# Patient Record
Sex: Female | Born: 1985 | Race: White | Hispanic: No | Marital: Single | Smoking: Never smoker
Health system: Southern US, Community
[De-identification: ages and names within clinical notes are randomized; demographics above are authoritative.]

## PROBLEM LIST (undated history)

## (undated) DIAGNOSIS — O24419 Gestational diabetes mellitus in pregnancy, unspecified control: Secondary | ICD-10-CM

## (undated) DIAGNOSIS — Z789 Other specified health status: Secondary | ICD-10-CM

## (undated) DIAGNOSIS — R519 Headache, unspecified: Secondary | ICD-10-CM

## (undated) HISTORY — DX: Gestational diabetes mellitus in pregnancy, unspecified control: O24.419

## (undated) HISTORY — PX: CHOLECYSTECTOMY: SHX55

## (undated) HISTORY — DX: Headache, unspecified: R51.9

---

## 2012-10-28 HISTORY — PX: CHOLECYSTECTOMY: SHX55

## 2018-10-28 NOTE — L&D Delivery Note (Signed)
Delivery Note At 11:51 AM a viable and healthy female was delivered via Vaginal, Spontaneous (Presentation: OA. Compound left arm).  APGAR: 9, 9; weight  pending.   Placenta status: spontaneous, intact, calcified,.  Cord: 3VC with the following complications: None.  Cord pH: NA  Anesthesia:  Epidural Episiotomy: None Lacerations: None Suture Repair: NA Est. Blood Loss (mL): 50  Mom to postpartum.  Baby to Couplet care / Skin to Skin. Placenta to: L&D Feeding: Breast Circ: Yes  Contraception: Pt planned PPBTL, but Consent was not signed. Discussed interval BTL and BC options until then.   Manya Silvas 04/06/2019, 12:45 PM

## 2018-11-18 ENCOUNTER — Ambulatory Visit (INDEPENDENT_AMBULATORY_CARE_PROVIDER_SITE_OTHER): Payer: Medicaid Other | Admitting: Obstetrics and Gynecology

## 2018-11-18 ENCOUNTER — Encounter: Payer: Self-pay | Admitting: Obstetrics and Gynecology

## 2018-11-18 ENCOUNTER — Other Ambulatory Visit (HOSPITAL_COMMUNITY)
Admission: RE | Admit: 2018-11-18 | Discharge: 2018-11-18 | Disposition: A | Discharge status: Home / Self Care | Payer: Medicaid Other | Source: Ambulatory Visit | Attending: Obstetrics and Gynecology | Admitting: Obstetrics and Gynecology

## 2018-11-18 VITALS — BP 127/79 | HR 96 | Ht 63.0 in | Wt 236.3 lb

## 2018-11-18 DIAGNOSIS — Z3490 Encounter for supervision of normal pregnancy, unspecified, unspecified trimester: Secondary | ICD-10-CM | POA: Insufficient documentation

## 2018-11-18 DIAGNOSIS — O99210 Obesity complicating pregnancy, unspecified trimester: Secondary | ICD-10-CM

## 2018-11-18 DIAGNOSIS — O99212 Obesity complicating pregnancy, second trimester: Secondary | ICD-10-CM

## 2018-11-18 DIAGNOSIS — Z3482 Encounter for supervision of other normal pregnancy, second trimester: Secondary | ICD-10-CM | POA: Diagnosis not present

## 2018-11-18 DIAGNOSIS — O0930 Supervision of pregnancy with insufficient antenatal care, unspecified trimester: Secondary | ICD-10-CM

## 2018-11-18 DIAGNOSIS — Z3480 Encounter for supervision of other normal pregnancy, unspecified trimester: Secondary | ICD-10-CM | POA: Diagnosis not present

## 2018-11-18 DIAGNOSIS — O0932 Supervision of pregnancy with insufficient antenatal care, second trimester: Secondary | ICD-10-CM

## 2018-11-18 NOTE — Progress Notes (Signed)
New OB Note  11/19/2018   Clinic: [facility]  Chief Complaint: NOB  Transfer of Care Patient: no  History of Present Illness: Ms. Nichole Edwards is a 33 y.o. G2P1 @ 19/1 weeks ([facility] 6/16 [tentative], based on Patient's last menstrual period was 07/07/2018 (within days).).  Preg complicated by has Encounter for supervision of normal pregnancy, antepartum; Late prenatal care; BMI 30s; and Obesity in pregnancy on their problem list.   Any events prior to today's visit: no She was using OCPs when she conceived.  She has Negative signs or symptoms of nausea/vomiting of pregnancy. She has Negative signs or symptoms of miscarriage or preterm labor  ROS: A 12-point review of systems was performed and negative, except as stated in the above HPI.  OBGYN History: As per HPI. OB History  Gravida Para Term Preterm AB Living  2 1 1     1   SAB TAB Ectopic Multiple Live Births               # Outcome Date GA Lbr Len/2nd Weight Sex Delivery Anes PTL Lv  2 Current           1 Term 2011   8 lb 10 oz (3.912 kg)  Vag-Spont       Any issues with any prior pregnancies: no Prior children are healthy, doing well, and without any problems or issues: no History of pap smears: No. Last pap smear Unknown   Past Medical History: History reviewed. No pertinent past medical history.  Past Surgical History: Past Surgical History:  Procedure Laterality Date  . CHOLECYSTECTOMY      Family History:  History reviewed. No pertinent family history. She denies any history of mental retardation, birth defects or genetic disorders in her or the FOB's history  Social History:  Social History   Socioeconomic History  . Marital status: Single    Spouse name: Not on file  . Number of children: Not on file  . Years of education: Not on file  . Highest education level: Not on file  Occupational History  . Not on file  Social Needs  . Financial resource strain: Not on file  . Food insecurity:    Worry: Not on file     Inability: Not on file  . Transportation needs:    Medical: Not on file    Non-medical: Not on file  Tobacco Use  . Smoking status: Not on file  Substance and Sexual Activity  . Alcohol use: Not on file  . Drug use: Not on file  . Sexual activity: Not on file  Lifestyle  . Physical activity:    Days per week: Not on file    Minutes per session: Not on file  . Stress: Not on file  Relationships  . Social connections:    Talks on phone: Not on file    Gets together: Not on file    Attends religious service: Not on file    Active member of club or organization: Not on file    Attends meetings of clubs or organizations: Not on file    Relationship status: Not on file  . Intimate partner violence:    Fear of current or ex partner: Not on file    Emotionally abused: Not on file    Physically abused: Not on file    Forced sexual activity: Not on file  Other Topics Concern  . Not on file  Social History Narrative  . Not on file  Allergy: Not on File  Health Maintenance:  Mammogram Up to Date: not applicable  Current Outpatient Medications: PNV  Physical Exam:   BP 127/79   Pulse 96   Ht 5\' 3"  (1.6 m)   Wt 236 lb 4.8 oz (107.2 kg)   LMP 07/07/2018 (Within Days)   BMI 41.86 kg/m  Body mass index is 41.86 kg/m. Contractions: Not present Vag. Bleeding: None. Fundal height: 20 FHTs: 150s  General appearance: Well nourished, well developed female in no acute distress.  Cardiovascular: S1, S2 normal, no murmur, rub or gallop, regular rate and rhythm Respiratory:  Clear to auscultation bilateral. Normal respiratory effort Abdomen: positive bowel sounds and no masses, hernias; diffusely non tender to palpation, non distended Breasts: patient declines any breast s/s. Neuro/Psych:  Normal mood and affect.  Skin:  Warm and dry.  Lymphatic:  No inguinal lymphadenopathy.   Pelvic exam: is not limited by body habitus EGBUS: within normal limits, Vagina: within  normal limits and with no blood in the vault, Cervix: normal appearing cervix without discharge or lesions, closed/long/high, Uterus:  enlarged, c/w 20 week size, and Adnexa:  normal adnexa and no mass, fullness, tenderness  Laboratory: none  Imaging:  Patient states had u/s on 11/12 and was given EDC of 6/17 based on u/s. Image on phone reviewed and dates shows 11/12. CRL not measured on that image but looks to be about 9wks  Assessment: pt stable  Plan: 1. Supervision of other normal pregnancy, antepartum Routine care. Anatomy u/s ordered. Confirm dating with u/s. Pt to bring images to next visit. Pt amenable to genetic screening - Cytology - PAP( [place]) - Culture, OB Urine - Hemoglobinopathy evaluation - Obstetric Panel, Including HIV - Inheritest(R) CF/SMA Panel - Genetic Screening - AFP, Serum, Open Spina Bifida - TSH - CMP and Liver - Protein / creatinine ratio, urine - [place] MFM OB DETAIL +14 WK; Future  2. Late prenatal care  3. Obesity in pregnancy - TSH - CMP and Liver - Protein / creatinine ratio, urine  Problem list reviewed and updated.  Follow up in 3 weeks.  The nature of [facility] with multiple MDs and other Advanced Practice Providers was explained to patient; also emphasized that residents, students are part of our team.  >50% of 25 min visit spent on counseling and coordination of care.     Cornelia Copa MD Attending Center for [facility])

## 2018-11-18 NOTE — Patient Instructions (Addendum)
Your tentative due date is June 16th and today you are 19 weeks and 1 day We will finalize this after your anatomy ultrasounds and the pictures you bring [place] from your first ultrasound

## 2018-11-19 ENCOUNTER — Ambulatory Visit (HOSPITAL_COMMUNITY)
Admission: RE | Admit: 2018-11-19 | Discharge: 2018-11-19 | Disposition: A | Discharge status: Home / Self Care | Payer: Medicaid Other | Source: Ambulatory Visit | Attending: Obstetrics and Gynecology | Admitting: Obstetrics and Gynecology

## 2018-11-19 ENCOUNTER — Encounter: Payer: Self-pay | Admitting: Obstetrics and Gynecology

## 2018-11-19 ENCOUNTER — Encounter (HOSPITAL_COMMUNITY): Payer: Self-pay

## 2018-11-19 ENCOUNTER — Encounter: Payer: Self-pay | Admitting: *Deleted

## 2018-11-19 DIAGNOSIS — Z3480 Encounter for supervision of other normal pregnancy, unspecified trimester: Secondary | ICD-10-CM | POA: Diagnosis not present

## 2018-11-19 DIAGNOSIS — Z3A19 19 weeks gestation of pregnancy: Secondary | ICD-10-CM | POA: Diagnosis not present

## 2018-11-19 DIAGNOSIS — O99212 Obesity complicating pregnancy, second trimester: Secondary | ICD-10-CM | POA: Diagnosis not present

## 2018-11-19 DIAGNOSIS — Z363 Encounter for antenatal screening for malformations: Secondary | ICD-10-CM

## 2018-11-19 HISTORY — DX: Other specified health status: Z78.9

## 2018-11-19 LAB — PROTEIN / CREATININE RATIO, URINE
Creatinine, Urine: 19.4 mg/dL
Protein, Ur: 9.3 mg/dL
Protein/Creat Ratio: 479 mg/g creat — ABNORMAL HIGH (ref 0–200)

## 2018-11-20 ENCOUNTER — Other Ambulatory Visit (HOSPITAL_COMMUNITY): Payer: Self-pay | Admitting: *Deleted

## 2018-11-20 DIAGNOSIS — O99212 Obesity complicating pregnancy, second trimester: Secondary | ICD-10-CM

## 2018-11-20 DIAGNOSIS — O99213 Obesity complicating pregnancy, third trimester: Secondary | ICD-10-CM

## 2018-11-20 LAB — CYTOLOGY - PAP
Chlamydia: NEGATIVE
Diagnosis: NEGATIVE
HPV: NOT DETECTED
Neisseria Gonorrhea: NEGATIVE

## 2018-11-21 LAB — URINE CULTURE, OB REFLEX

## 2018-11-21 LAB — CULTURE, OB URINE

## 2018-11-23 ENCOUNTER — Encounter: Payer: Self-pay | Admitting: Obstetrics and Gynecology

## 2018-11-23 DIAGNOSIS — Z3480 Encounter for supervision of other normal pregnancy, unspecified trimester: Secondary | ICD-10-CM

## 2018-11-23 DIAGNOSIS — O2340 Unspecified infection of urinary tract in pregnancy, unspecified trimester: Secondary | ICD-10-CM | POA: Insufficient documentation

## 2018-11-23 MED ORDER — NITROFURANTOIN MONOHYD MACRO 100 MG PO CAPS: ORAL_CAPSULE | ORAL | 0 refills | Status: DC

## 2018-11-23 NOTE — Addendum Note (Signed)
Addended by: [place] Bing on: 11/23/2018 07:52 AM   Modules accepted: Orders

## 2018-12-02 LAB — CMP AND LIVER
ALT: 8 IU/L (ref 0–32)
AST: 10 IU/L (ref 0–40)
Albumin: 3.7 g/dL — ABNORMAL LOW (ref 3.8–4.8)
Alkaline Phosphatase: 53 IU/L (ref 39–117)
BUN: 5 mg/dL — ABNORMAL LOW (ref 6–20)
Bilirubin Total: <0.2 mg/dL (ref 0.0–1.2)
Bilirubin, Direct: 0.04 mg/dL (ref 0.00–0.40)
CO2: 22 mmol/L (ref 20–29)
Calcium: 9.3 mg/dL (ref 8.7–10.2)
Chloride: 102 mmol/L (ref 96–106)
Creatinine, Ser: 0.58 mg/dL (ref 0.57–1.00)
GFR calc Af Amer: 141 mL/min/{1.73_m2} (ref >59)
GFR calc non Af Amer: 122 mL/min/{1.73_m2} (ref >59)
Glucose: 94 mg/dL (ref 65–99)
Potassium: 4 mmol/L (ref 3.5–5.2)
Sodium: 139 mmol/L (ref 134–144)
Total Protein: 6.3 g/dL (ref 6.0–8.5)

## 2018-12-02 LAB — INHERITEST(R) CF/SMA PANEL

## 2018-12-02 LAB — OBSTETRIC PANEL, INCLUDING HIV
Antibody Screen: NEGATIVE
Basophils Absolute: 0 10*3/uL (ref 0.0–0.2)
Basos: 0 %
EOS (ABSOLUTE): 0.2 10*3/uL (ref 0.0–0.4)
Eos: 2 %
HEMOGLOBIN: 11 g/dL — AB (ref 11.1–15.9)
HIV SCREEN 4TH GENERATION: NONREACTIVE
Hematocrit: 32 % — ABNORMAL LOW (ref 34.0–46.6)
Hepatitis B Surface Ag: NEGATIVE
Immature Grans (Abs): 0.1 10*3/uL (ref 0.0–0.1)
Immature Granulocytes: 1 %
Lymphocytes Absolute: 2.5 10*3/uL (ref 0.7–3.1)
Lymphs: 22 %
MCH: 31.5 pg (ref 26.6–33.0)
MCHC: 34.4 g/dL (ref 31.5–35.7)
MCV: 92 fL (ref 79–97)
Monocytes Absolute: 0.7 10*3/uL (ref 0.1–0.9)
Monocytes: 6 %
NEUTROS ABS: 8 10*3/uL — AB (ref 1.4–7.0)
Neutrophils: 69 %
Platelets: 291 10*3/uL (ref 150–450)
RBC: 3.49 x10E6/uL — ABNORMAL LOW (ref 3.77–5.28)
RDW: 12.8 % (ref 11.7–15.4)
RPR Ser Ql: NONREACTIVE
Rh Factor: POSITIVE
Rubella Antibodies, IGG: 1.34 index (ref >0.99)
WBC: 11.6 10*3/uL — ABNORMAL HIGH (ref 3.4–10.8)

## 2018-12-02 LAB — AFP, SERUM, OPEN SPINA BIFIDA
AFP MoM: 1.08
AFP Value: 41.4 ng/mL
GEST. AGE ON COLLECTION DATE: 19.1 wk
Maternal Age At EDD: 32.4 yr
OSBR Risk 1 IN: 9540
Test Results:: NEGATIVE
Weight: 236 [lb_av]

## 2018-12-02 LAB — HEMOGLOBINOPATHY EVALUATION
HGB C: 0 %
HGB S: 0 %
HGB VARIANT: 0 %
Hemoglobin A2 Quantitation: 2.2 % (ref 1.8–3.2)
Hemoglobin F Quantitation: 0 % (ref 0.0–2.0)
Hgb A: 97.8 % (ref 96.4–98.8)

## 2018-12-02 LAB — TSH: TSH: 3.41 u[IU]/mL (ref 0.450–4.500)

## 2018-12-09 ENCOUNTER — Encounter: Payer: Self-pay | Admitting: Obstetrics and Gynecology

## 2018-12-09 ENCOUNTER — Ambulatory Visit (INDEPENDENT_AMBULATORY_CARE_PROVIDER_SITE_OTHER): Payer: Medicaid Other | Admitting: Obstetrics and Gynecology

## 2018-12-09 ENCOUNTER — Other Ambulatory Visit: Payer: Self-pay

## 2018-12-09 ENCOUNTER — Encounter: Payer: Self-pay | Admitting: Family Medicine

## 2018-12-09 VITALS — BP 104/68 | HR 88 | Wt 240.9 lb

## 2018-12-09 DIAGNOSIS — Z3480 Encounter for supervision of other normal pregnancy, unspecified trimester: Secondary | ICD-10-CM

## 2018-12-09 DIAGNOSIS — O2342 Unspecified infection of urinary tract in pregnancy, second trimester: Secondary | ICD-10-CM

## 2018-12-09 DIAGNOSIS — Z3482 Encounter for supervision of other normal pregnancy, second trimester: Secondary | ICD-10-CM

## 2018-12-09 DIAGNOSIS — O99210 Obesity complicating pregnancy, unspecified trimester: Secondary | ICD-10-CM

## 2018-12-09 DIAGNOSIS — O2340 Unspecified infection of urinary tract in pregnancy, unspecified trimester: Secondary | ICD-10-CM

## 2018-12-09 DIAGNOSIS — Z3A22 22 weeks gestation of pregnancy: Secondary | ICD-10-CM

## 2018-12-09 DIAGNOSIS — O99212 Obesity complicating pregnancy, second trimester: Secondary | ICD-10-CM

## 2018-12-09 NOTE — Patient Instructions (Signed)

## 2018-12-09 NOTE — Progress Notes (Signed)
Subjective:  Nichole Edwards is a 33 y.o. G2P1001 at [redacted]w[redacted]d being seen today for ongoing prenatal care.  She is currently monitored for the following issues for this low-risk pregnancy and has Encounter for supervision of normal pregnancy, antepartum; Late prenatal care; BMI 30s; Obesity in pregnancy; and UTI in pregnancy, antepartum on their problem list.  Patient reports no complaints.  Contractions: Not present. Vag. Bleeding: None.  Movement: Present. Denies leaking of fluid.   The following portions of the patient's history were reviewed and updated as appropriate: allergies, current medications, past family history, past medical history, past social history, past surgical history and problem list. Problem list updated.  Objective:   Vitals:   12/09/18 1446  BP: 104/68  Pulse: 88  Weight: 240 lb 14.4 oz (109.3 kg)    Fetal Status: Fetal Heart Rate (bpm): 150   Movement: Present     General:  Alert, oriented and cooperative. Patient is in no acute distress.  Skin: Skin is warm and dry. No rash noted.   Cardiovascular: Normal heart rate noted  Respiratory: Normal respiratory effort, no problems with respiration noted  Abdomen: Soft, gravid, appropriate for gestational age. Pain/Pressure: Absent     Pelvic:  Cervical exam deferred        Extremities: Normal range of motion.  Edema: None  Mental Status: Normal mood and affect. Normal behavior. Normal judgment and thought content.   Urinalysis:      Assessment and Plan:  Pregnancy: G2P1001 at [redacted]w[redacted]d  1. Supervision of other normal pregnancy, antepartum Stable Glucola next visit  2. Obesity in pregnancy Wt gain good Repeat urine P/C next visit  3. UTI in pregnancy, antepartum TOC urine culture next visit  Preterm labor symptoms and general obstetric precautions including but not limited to vaginal bleeding, contractions, leaking of fluid and fetal movement were reviewed in detail with the patient. Please refer to After Visit  Summary for other counseling recommendations.  Return in about 4 weeks (around 01/06/2019) for OB visit.   Hermina Staggers, MD

## 2019-01-06 ENCOUNTER — Other Ambulatory Visit: Payer: Self-pay

## 2019-01-06 ENCOUNTER — Encounter: Payer: Self-pay | Admitting: Obstetrics & Gynecology

## 2019-01-06 ENCOUNTER — Other Ambulatory Visit: Payer: Medicaid Other

## 2019-01-06 ENCOUNTER — Ambulatory Visit (INDEPENDENT_AMBULATORY_CARE_PROVIDER_SITE_OTHER): Payer: Medicaid Other | Admitting: Obstetrics & Gynecology

## 2019-01-06 VITALS — BP 126/81 | HR 89 | Wt 247.0 lb

## 2019-01-06 DIAGNOSIS — Z3A26 26 weeks gestation of pregnancy: Secondary | ICD-10-CM

## 2019-01-06 DIAGNOSIS — Z3480 Encounter for supervision of other normal pregnancy, unspecified trimester: Secondary | ICD-10-CM | POA: Diagnosis not present

## 2019-01-06 DIAGNOSIS — O24419 Gestational diabetes mellitus in pregnancy, unspecified control: Secondary | ICD-10-CM

## 2019-01-06 NOTE — Patient Instructions (Signed)
Chronic Constipation  Chronic constipation is a condition in which a person has three or fewer bowel movements a week, for three months or longer. This condition is especially common in older adults. The two main kinds of chronic constipation are secondary constipation and functional constipation. Secondary constipation results from another condition or a treatment. Functional constipation, also called primary or idiopathic constipation, is divided into three types:  Normal transit constipation. In this type, movement of stool through the colon (stool transit) occurs normally.  Slow transit constipation. In this type, stool moves slowly through the colon.  Outlet constipation or pelvic floor dysfunction. In this type, the nerves and muscles that empty the rectum do not work normally. What are the causes? Causes of secondary constipation may include:  Failing to drink enough fluid, eat enough food or fiber, or get physically active.  Pregnancy.  A tear in the anus (anal fissure).  Blockage in the bowel (bowel obstruction).  Narrowing of the bowel (bowel stricture).  Having a long-term medical condition, such as: ? Diabetes. ? Hypothyroidism. ? Multiple sclerosis. ? Parkinson disease. ? Stroke. ? Spinal cord injury. ? Dementia. ? Colon cancer. ? Inflammatory bowel disease (IBD). ? Iron-deficiency anemia. ? Outward collapse of the rectum (rectal prolapse). ? Hemorrhoids.  Taking certain medicines, including: ? Narcotics. These are a certain type of prescription pain medicine. ? Antacids. ? Iron supplements. ? Water pills (diuretics). ? Certain blood pressure medicines. ? Anti-seizure medicines. ? Antidepressants. ? Medicines for Parkinson disease. The cause of functional constipation is not known, but some conditions are associated with it. These conditions include:  Stress.  Problems in the nerves and muscles that control stool transit.  Weak or impaired pelvic floor  muscles. What increases the risk? You may be at higher risk for chronic constipation if you:  Are older than age 70.  Are female.  Live in a long-term care facility.  Do not get much exercise or physical activity (have a sedentary lifestyle).  Do not drink enough fluids.  Do not eat enough food, especially fiber.  Have a long-term disease.  Have a mental health disorder or eating disorder.  Take many medicines. What are the signs or symptoms? The main symptom of chronic constipation is having three or fewer bowel movements a week for several weeks. Other signs and symptoms may vary from person to person. These include:  Pushing hard (straining) to pass stool.  Painful bowel movements.  Having hard or lumpy stools.  Having lower belly discomfort, such as cramps or bloating.  Being unable to have a bowel movement when you feel the urge.  Feeling like you still need to pass stool after a bowel movement.  Feeling that you have something in your rectum that is blocking or preventing bowel movements.  Seeing blood on the toilet paper or in your stool.  Worsening confusion (in older adults). How is this diagnosed? This condition may be diagnosed based on:  Symptoms and medical history. You will be asked about your symptoms, lifestyle, diet, and any medicines that you are taking.  Physical exam. ? Your belly (abdomen) will be examined. ? A digital rectal exam may be done. For this exam, a health care provider places a lubricated, gloved finger into the rectum.  Other tests to check for any underlying causes of your constipation. These may be ordered if you have bleeding in your rectum, weight loss, or a family history of colon cancer. In these cases, you may have: ? Imaging studies of   the colon. These may include X-ray, ultrasound, or CT scan. ? Blood tests. ? A procedure to examine the inside of your colon (colonoscopy). ? More specialized tests to check:  Whether  your anal sphincter works well. This is a ring-shaped muscle that controls the closing of the anus.  How well food moves through your colon. ? Tests to measure the nerve signal in your pelvic floor muscles (electromyography). How is this treated? Treatment for chronic constipation depends on the cause. Most often, treatment starts with:  Being more active and getting regular exercise.  Drinking more fluids.  Adding fiber to your diet. Sources of fiber include fruits, vegetables, whole grains, and fiber supplements.  Using medicines such as stool softeners or medicines that increase contractions in your digestive system (pro-motility agents).  Training your pelvic muscles with biofeedback.  Surgery, if there is obstruction. Treatment for secondary chronic constipation depends on the underlying condition. You may need to:  Stop or change some medicines if they cause constipation.  Use a fiber supplement (bulk laxative) or stool softener.  Use prescription laxative. This works by absorbing water into your colon (osmotic laxative). You may also need to see a specialist who treats conditions of the digestive system (gastroenterologist). Follow these instructions at home:   Take over-the-counter and prescription medicines only as told by your health care provider.  If you are taking a laxative, take it as told by your health care provider.  Eat a balanced diet that includes enough fiber. Ask your health care provider to recommend a diet that is right for you.  Drink clear fluids, especially water. Avoid drinking alcohol, caffeine, and soda.  Drink enough fluid to keep your urine pale yellow.  Get some physical activity every day. Ask your health care provider what physical activities are safe for you.  Get colon cancer screenings as told by your health care provider.  Keep all follow-up visits as told by your health care provider. This is important. Contact a health care  provider if:  You are having three or fewer bowel movements a week.  Your stools are hard or lumpy.  You notice blood on the toilet paper or in your stool after you have a bowel movement.  You have unexplained weight loss.  You have rectum (rectal) pain.  You have stool leakage.  You experience nausea or vomiting. Get help right away if:  You have rectal bleeding or you pass blood clots.  You have severe rectal pain.  You have body tissue that pushes out (protrudes) from your anus.  You have severe pain or bloating (distension) in your abdomen.  You have vomiting that you cannot control. Summary  Chronic constipation is a condition in which a person has three or fewer bowel movements a week, for three months or longer.  You may have a higher risk for this condition if you are an older adult, or if you do not drink enough water or get enough physical activity (are sedentary).  Treatment for this condition depends on the cause. Most treatments for chronic constipation include adding fiber to your diet, drinking more fluids, and getting more physical activity. You may also need to treat any underlying medical conditions or stop or change certain medicines if they cause constipation.  If lifestyle changes do not relieve constipation, your health care provider may recommend taking a laxative. This information is not intended to replace advice given to you by your health care provider. Make sure you discuss any questions you   have with your health care provider. Document Released: 05/13/2017 Document Revised: 07/01/2017 Document Reviewed: 07/01/2017 Elsevier Interactive Patient Education  2019 Elsevier Inc.  

## 2019-01-06 NOTE — Progress Notes (Signed)
   PRENATAL VISIT NOTE  Subjective:  Nichole Edwards is a 33 y.o. G2P1001 at [redacted]w[redacted]d being seen today for ongoing prenatal care.  She is currently monitored for the following issues for this low-risk pregnancy and has Encounter for supervision of normal pregnancy, antepartum; Late prenatal care; Morbid obesity (HCC); Obesity in pregnancy; and UTI in pregnancy, antepartum on their problem list.  Patient reports constipation, last BM 1 week ago, has been chronic.  Contractions: Not present. Vag. Bleeding: None.  Movement: Present. Denies leaking of fluid.   The following portions of the patient's history were reviewed and updated as appropriate: allergies, current medications, past family history, past medical history, past social history, past surgical history and problem list.   Objective:   Vitals:   01/06/19 0819  BP: 126/81  Pulse: 89  Weight: 112 kg    Fetal Status: Fetal Heart Rate (bpm): 142   Movement: Present     General:  Alert, oriented and cooperative. Patient is in no acute distress.  Skin: Skin is warm and dry. No rash noted.   Cardiovascular: Normal heart rate noted  Respiratory: Normal respiratory effort, no problems with respiration noted  Abdomen: Soft, gravid, appropriate for gestational age.  Pain/Pressure: Present     Pelvic: Cervical exam deferred        Extremities: Normal range of motion.  Edema: None  Mental Status: Normal mood and affect. Normal behavior. Normal judgment and thought content.   Assessment and Plan:  Pregnancy: G2P1001 at [redacted]w[redacted]d 1. Supervision of other normal pregnancy, antepartum Routine 2nd trimester - CBC - Glucose Tolerance, 2 Hours w/1 Hour - RPR - HIV Antibody (routine testing w rflx)  Preterm labor symptoms and general obstetric precautions including but not limited to vaginal bleeding, contractions, leaking of fluid and fetal movement were reviewed in detail with the patient. Please refer to After Visit Summary for other counseling  recommendations.  Fiber laxative daily, use Dulcolax today Return in about 2 weeks (around 01/20/2019).  Future Appointments  Date Time Provider Department Center  02/19/2019 10:30 AM [facility] [place] 1 [facility]    Scheryl Darter, MD

## 2019-01-07 DIAGNOSIS — O24419 Gestational diabetes mellitus in pregnancy, unspecified control: Secondary | ICD-10-CM | POA: Insufficient documentation

## 2019-01-07 LAB — HIV ANTIBODY (ROUTINE TESTING W REFLEX): HIV Screen 4th Generation wRfx: NONREACTIVE

## 2019-01-07 LAB — CBC
Hematocrit: 30 % — ABNORMAL LOW (ref 34.0–46.6)
Hemoglobin: 10.6 g/dL — ABNORMAL LOW (ref 11.1–15.9)
MCH: 33.1 pg — ABNORMAL HIGH (ref 26.6–33.0)
MCHC: 35.3 g/dL (ref 31.5–35.7)
MCV: 94 fL (ref 79–97)
Platelets: 245 10*3/uL (ref 150–450)
RBC: 3.2 x10E6/uL — ABNORMAL LOW (ref 3.77–5.28)
RDW: 12.7 % (ref 11.7–15.4)
WBC: 12.4 10*3/uL — ABNORMAL HIGH (ref 3.4–10.8)

## 2019-01-07 LAB — GLUCOSE TOLERANCE, 2 HOURS W/ 1HR
GLUCOSE, 2 HOUR: 131 mg/dL (ref 65–152)
Glucose, 1 hour: 183 mg/dL — ABNORMAL HIGH (ref 65–179)
Glucose, Fasting: 88 mg/dL (ref 65–91)

## 2019-01-07 LAB — RPR: RPR Ser Ql: NONREACTIVE

## 2019-01-08 ENCOUNTER — Other Ambulatory Visit: Payer: Self-pay

## 2019-01-08 DIAGNOSIS — O24419 Gestational diabetes mellitus in pregnancy, unspecified control: Secondary | ICD-10-CM

## 2019-01-08 MED ORDER — GLUCOSE BLOOD VI STRP: ORAL_STRIP | 12 refills | Status: DC

## 2019-01-08 MED ORDER — ACCU-CHEK FASTCLIX LANCETS MISC: 1.0000 | Freq: Four times a day (QID) | 12 refills | Status: DC

## 2019-01-08 MED ORDER — ACCU-CHEK GUIDE W/DEVICE KIT: 1.0000 | PACK | Freq: Four times a day (QID) | 0 refills | Status: DC

## 2019-01-08 NOTE — Progress Notes (Signed)
Diabetes referral and supplies

## 2019-01-11 MED ORDER — GLUCOSE BLOOD VI STRP: ORAL_STRIP | 12 refills | Status: DC

## 2019-01-11 NOTE — Addendum Note (Signed)
Addended by: Adam Phenix on: 01/11/2019 02:31 PM   Modules accepted: Orders

## 2019-01-13 ENCOUNTER — Encounter: Payer: Medicaid Other | Attending: Obstetrics and Gynecology | Admitting: Registered"

## 2019-01-13 ENCOUNTER — Other Ambulatory Visit: Payer: Self-pay

## 2019-01-13 DIAGNOSIS — O99810 Abnormal glucose complicating pregnancy: Secondary | ICD-10-CM | POA: Insufficient documentation

## 2019-01-15 ENCOUNTER — Encounter: Payer: Self-pay | Admitting: Registered"

## 2019-01-15 DIAGNOSIS — O99810 Abnormal glucose complicating pregnancy: Secondary | ICD-10-CM | POA: Insufficient documentation

## 2019-01-15 NOTE — Progress Notes (Signed)

## 2019-01-20 ENCOUNTER — Encounter: Payer: Self-pay | Admitting: Obstetrics and Gynecology

## 2019-01-20 ENCOUNTER — Encounter: Payer: Medicaid Other | Admitting: Obstetrics and Gynecology

## 2019-01-20 ENCOUNTER — Other Ambulatory Visit: Payer: Self-pay

## 2019-01-20 ENCOUNTER — Ambulatory Visit (INDEPENDENT_AMBULATORY_CARE_PROVIDER_SITE_OTHER): Payer: Medicaid Other | Admitting: Obstetrics and Gynecology

## 2019-01-20 DIAGNOSIS — Z3A28 28 weeks gestation of pregnancy: Secondary | ICD-10-CM | POA: Diagnosis not present

## 2019-01-20 DIAGNOSIS — O99213 Obesity complicating pregnancy, third trimester: Secondary | ICD-10-CM | POA: Diagnosis not present

## 2019-01-20 DIAGNOSIS — O99210 Obesity complicating pregnancy, unspecified trimester: Secondary | ICD-10-CM

## 2019-01-20 DIAGNOSIS — O24419 Gestational diabetes mellitus in pregnancy, unspecified control: Secondary | ICD-10-CM | POA: Diagnosis not present

## 2019-01-20 DIAGNOSIS — Z3480 Encounter for supervision of other normal pregnancy, unspecified trimester: Secondary | ICD-10-CM

## 2019-01-20 NOTE — Progress Notes (Signed)
   TELEHEALTH VIRTUAL OBSTETRICS VISIT ENCOUNTER NOTE  I connected with Nichole Edwards on 01/20/19 at  3:15 PM EDT by telephone at home and verified that I am speaking with the correct person using two identifiers.  I discussed the limitations, risks, security and privacy concerns of performing an evaluation and management service by telephone and the availability of in person appointments. I also discussed with the patient that there may be a patient responsible charge related to this service. The patient expressed understanding and agreed to proceed.  Subjective:  Nichole Edwards is a 33 y.o. G2P1001 at [redacted]w[redacted]d being followed for ongoing prenatal care.  She is currently monitored for the following issues for this high-risk pregnancy and has Encounter for supervision of normal pregnancy, antepartum; Late prenatal care; Morbid obesity (HCC); Obesity in pregnancy; UTI in pregnancy, antepartum; Gestational diabetes mellitus; and Abnormal glucose tolerance test (GTT) during pregnancy, antepartum on their problem list.  Patient reports no complaints. Reports fetal movement. Denies any contractions, bleeding or leaking of fluid.   The following portions of the patient's history were reviewed and updated as appropriate: allergies, current medications, past family history, past medical history, past social history, past surgical history and problem list.   Objective:   General:  Alert, oriented and cooperative.   Mental Status: Normal mood and affect perceived. Normal judgment and thought content.  Rest of physical exam deferred due to type of encounter  Assessment and Plan:  Pregnancy: G2P1001 at [redacted]w[redacted]d  1. Supervision of other normal pregnancy, antepartum  2. Gestational diabetes mellitus (GDM) in third trimester, gestational diabetes method of control unspecified Has been checking CBGs for about 10 days now Fasting BG: range 87-102, pt reports 6 of these values were > 95 After meals BG: 66- 158, pt  reports 4 were > 120 Pt reports higher fasting glucose were mostly before she had nutrition consult, will cont diet control for now Has next [place] scheduled for 02/19/19  3. Obesity in pregnancy    Preterm labor symptoms and general obstetric precautions including but not limited to vaginal bleeding, contractions, leaking of fluid and fetal movement were reviewed in detail with the patient.  I discussed the assessment and treatment plan with the patient. The patient was provided an opportunity to ask questions and all were answered. The patient agreed with the plan and demonstrated an understanding of the instructions. The patient was advised to call back or seek an in-person office evaluation/go to [facility]U at [facility] for any urgent or concerning symptoms. Please refer to After Visit Summary for other counseling recommendations.   Return in about 2 weeks (around 02/03/2019).  Future Appointments  Date Time Provider Department Center  01/20/2019  3:15 PM Conan Bowens, MD [facility] None  02/19/2019 10:30 AM [facility] [place] 1 [facility]    Conan Bowens, MD Center for [facility], [facility]

## 2019-01-20 NOTE — Progress Notes (Signed)
Pt is on phone for televisit. ROB [redacted]w[redacted]d.  Fasting BG: range 87-102 After meals BG: 66- 158

## 2019-02-03 ENCOUNTER — Ambulatory Visit (INDEPENDENT_AMBULATORY_CARE_PROVIDER_SITE_OTHER): Payer: Medicaid Other | Admitting: Obstetrics and Gynecology

## 2019-02-03 ENCOUNTER — Encounter: Payer: Self-pay | Admitting: Obstetrics and Gynecology

## 2019-02-03 ENCOUNTER — Other Ambulatory Visit: Payer: Self-pay

## 2019-02-03 DIAGNOSIS — Z3A30 30 weeks gestation of pregnancy: Secondary | ICD-10-CM | POA: Diagnosis not present

## 2019-02-03 DIAGNOSIS — O24410 Gestational diabetes mellitus in pregnancy, diet controlled: Secondary | ICD-10-CM | POA: Diagnosis not present

## 2019-02-03 DIAGNOSIS — Z3480 Encounter for supervision of other normal pregnancy, unspecified trimester: Secondary | ICD-10-CM

## 2019-02-03 NOTE — Progress Notes (Signed)
S/w pt viai tele visit, pt reports fetal movement, denies pain. Pt states that BG results today are, Fasting 93, After breakfast 109. Pt states that she will look into purchasing a BP cuff.

## 2019-02-03 NOTE — Progress Notes (Signed)
   TELEHEALTH VIRTUAL OBSTETRICS VISIT ENCOUNTER NOTE  I connected with Nichole Edwards on 02/03/19 at  1:45 PM EDT by telephone at home and verified that I am speaking with the correct person using two identifiers.   I discussed the limitations, risks, security and privacy concerns of performing an evaluation and management service by telephone and the availability of in person appointments. I also discussed with the patient that there may be a patient responsible charge related to this service. The patient expressed understanding and agreed to proceed.  Subjective:  Nichole Edwards is a 33 y.o. G2P1001 at [redacted]w[redacted]d being followed for ongoing prenatal care.  She is currently monitored for the following issues for this high-risk pregnancy and has Encounter for supervision of normal pregnancy, antepartum; Late prenatal care; Morbid obesity (HCC); Obesity in pregnancy; UTI in pregnancy, antepartum; Gestational diabetes mellitus; and Abnormal glucose tolerance test (GTT) during pregnancy, antepartum on their problem list.  Patient reports no complaints. Reports fetal movement. Denies any contractions, bleeding or leaking of fluid.   The following portions of the patient's history were reviewed and updated as appropriate: allergies, current medications, past family history, past medical history, past social history, past surgical history and problem list.   Objective:   General:  Alert, oriented and cooperative.   Mental Status: Normal mood and affect perceived. Normal judgment and thought content.  Rest of physical exam deferred due to type of encounter  Assessment and Plan:  Pregnancy: G2P1001 at [redacted]w[redacted]d 1. Supervision of other normal pregnancy, antepartum Stable - Babyscripts Schedule Optimization  2. Diet controlled gestational diabetes mellitus (GDM) in third trimester Reports CBG's in goal range Will continue with diet  Preterm labor symptoms and general obstetric precautions including but not  limited to vaginal bleeding, contractions, leaking of fluid and fetal movement were reviewed in detail with the patient.  I discussed the assessment and treatment plan with the patient. The patient was provided an opportunity to ask questions and all were answered. The patient agreed with the plan and demonstrated an understanding of the instructions. The patient was advised to call back or seek an in-person office evaluation/go to [facility]U at [facility] for any urgent or concerning symptoms. Please refer to After Visit Summary for other counseling recommendations.   I provided 11 minutes of non-face-to-face time during this encounter.  Return in about 2 years (around 02/02/2021) for OB visit televisit.  Future Appointments  Date Time Provider Department Center  02/19/2019 10:30 AM [facility] [place] 1 WH-MFCUS MFC-US    Hermina Staggers, MD Center for [facility], [facility]

## 2019-02-17 ENCOUNTER — Ambulatory Visit (INDEPENDENT_AMBULATORY_CARE_PROVIDER_SITE_OTHER): Payer: Medicaid Other | Admitting: Obstetrics & Gynecology

## 2019-02-17 ENCOUNTER — Encounter: Payer: Medicaid Other | Admitting: Obstetrics & Gynecology

## 2019-02-17 ENCOUNTER — Encounter: Payer: Self-pay | Admitting: Obstetrics & Gynecology

## 2019-02-17 ENCOUNTER — Other Ambulatory Visit: Payer: Self-pay

## 2019-02-17 DIAGNOSIS — O24410 Gestational diabetes mellitus in pregnancy, diet controlled: Secondary | ICD-10-CM

## 2019-02-17 DIAGNOSIS — Z3A32 32 weeks gestation of pregnancy: Secondary | ICD-10-CM | POA: Diagnosis not present

## 2019-02-17 DIAGNOSIS — Z3480 Encounter for supervision of other normal pregnancy, unspecified trimester: Secondary | ICD-10-CM

## 2019-02-17 NOTE — Progress Notes (Signed)
Pt is on the phone preparing for Webex visit with provider. [redacted]w[redacted]d. Pt is checking BG levels at home: Fasting (88) After meals (highest 133). Pt does not have BP cuff at home, babyscripts order put in.

## 2019-02-17 NOTE — Progress Notes (Signed)
   TELEHEALTH VIRTUAL OBSTETRICS VISIT ENCOUNTER NOTE  I connected with Nichole Edwards on 02/17/19 at 10:30 AM EDT by telephone at home and verified that I am speaking with the correct person using two identifiers.   I discussed the limitations, risks, security and privacy concerns of performing an evaluation and management service by telephone and the availability of in person appointments. I also discussed with the patient that there may be a patient responsible charge related to this service. The patient expressed understanding and agreed to proceed.  Subjective:  Nichole Edwards is a 33 y.o. G2P1001 at [redacted]w[redacted]d being followed for ongoing prenatal care.  She is currently monitored for the following issues for this high-risk pregnancy and has Encounter for supervision of normal pregnancy, antepartum; Late prenatal care; Morbid obesity (HCC); Obesity in pregnancy; UTI in pregnancy, antepartum; Gestational diabetes mellitus; and Abnormal glucose tolerance test (GTT) during pregnancy, antepartum on their problem list.  Patient reports no complaints. Reports fetal movement. Denies any contractions, bleeding or leaking of fluid.   The following portions of the patient's history were reviewed and updated as appropriate: allergies, current medications, past family history, past medical history, past social history, past surgical history and problem list.   Objective:   General:  Alert, oriented and cooperative.   Mental Status: Normal mood and affect perceived. Normal judgment and thought content.  Rest of physical exam deferred due to type of encounter  Assessment and Plan:  Pregnancy: G2P1001 at [redacted]w[redacted]d 1. Supervision of other normal pregnancy, antepartum BP cuff - Babyscripts Schedule Optimization  2. Diet controlled gestational diabetes mellitus (GDM) in third trimester FBS<95 and PP<120  Preterm labor symptoms and general obstetric precautions including but not limited to vaginal bleeding,  contractions, leaking of fluid and fetal movement were reviewed in detail with the patient.  I discussed the assessment and treatment plan with the patient. The patient was provided an opportunity to ask questions and all were answered. The patient agreed with the plan and demonstrated an understanding of the instructions. The patient was advised to call back or seek an in-person office evaluation/go to MAU at [facility] for any urgent or concerning symptoms. Please refer to After Visit Summary for other counseling recommendations.   I provided 10 minutes of non-face-to-face time during this encounter.  Return in about 2 weeks (around 03/03/2019).  Future Appointments  Date Time Provider Department Center  02/17/2019 10:30 AM Adam Phenix, MD [facility] None  02/19/2019 10:30 AM WH-M[facility] [place] 1 WH-MFCUS MFC-US    Scheryl Darter, MD Center for [facility], [facility]

## 2019-02-17 NOTE — Patient Instructions (Signed)

## 2019-02-19 ENCOUNTER — Ambulatory Visit (HOSPITAL_COMMUNITY)
Admission: RE | Admit: 2019-02-19 | Discharge: 2019-02-19 | Disposition: A | Discharge status: Home / Self Care | Payer: Medicaid Other | Source: Ambulatory Visit | Attending: Obstetrics and Gynecology | Admitting: Obstetrics and Gynecology

## 2019-02-19 ENCOUNTER — Other Ambulatory Visit (HOSPITAL_COMMUNITY): Payer: Self-pay | Admitting: *Deleted

## 2019-02-19 ENCOUNTER — Ambulatory Visit (HOSPITAL_COMMUNITY): Payer: Medicaid Other | Admitting: *Deleted

## 2019-02-19 ENCOUNTER — Encounter (HOSPITAL_COMMUNITY): Payer: Self-pay

## 2019-02-19 ENCOUNTER — Other Ambulatory Visit: Payer: Self-pay

## 2019-02-19 VITALS — BP 121/68 | HR 85 | Temp 98.8°F

## 2019-02-19 DIAGNOSIS — O24410 Gestational diabetes mellitus in pregnancy, diet controlled: Secondary | ICD-10-CM

## 2019-02-19 DIAGNOSIS — O99213 Obesity complicating pregnancy, third trimester: Secondary | ICD-10-CM | POA: Diagnosis not present

## 2019-02-19 DIAGNOSIS — Z362 Encounter for other antenatal screening follow-up: Secondary | ICD-10-CM

## 2019-02-19 NOTE — Progress Notes (Unsigned)
.  mfm

## 2019-03-03 ENCOUNTER — Ambulatory Visit (INDEPENDENT_AMBULATORY_CARE_PROVIDER_SITE_OTHER): Payer: Medicaid Other | Admitting: Obstetrics

## 2019-03-03 ENCOUNTER — Encounter: Payer: Self-pay | Admitting: Obstetrics

## 2019-03-03 ENCOUNTER — Other Ambulatory Visit: Payer: Self-pay

## 2019-03-03 DIAGNOSIS — O0990 Supervision of high risk pregnancy, unspecified, unspecified trimester: Secondary | ICD-10-CM

## 2019-03-03 DIAGNOSIS — O99213 Obesity complicating pregnancy, third trimester: Secondary | ICD-10-CM

## 2019-03-03 DIAGNOSIS — O24410 Gestational diabetes mellitus in pregnancy, diet controlled: Secondary | ICD-10-CM

## 2019-03-03 DIAGNOSIS — Z3A34 34 weeks gestation of pregnancy: Secondary | ICD-10-CM

## 2019-03-03 DIAGNOSIS — O99210 Obesity complicating pregnancy, unspecified trimester: Secondary | ICD-10-CM

## 2019-03-03 DIAGNOSIS — O0993 Supervision of high risk pregnancy, unspecified, third trimester: Secondary | ICD-10-CM

## 2019-03-03 MED ORDER — BLOOD PRESSURE MONITOR/S CUFF MISC: 0 refills | Status: DC

## 2019-03-03 MED ORDER — BLOOD PRESSURE CUFF MISC: 1.0000 | Freq: Every day | 0 refills | Status: DC

## 2019-03-03 NOTE — Progress Notes (Signed)
Webex ROB:  Pt c/o sharp shooting pain while laying down or walking long discharge Highest fasting (94) after dinner highest (108)

## 2019-03-03 NOTE — Progress Notes (Signed)
   TELEHEALTH VIRTUAL OBSTETRICS PRENATAL VISIT ENCOUNTER NOTE  I connected with Nichole Edwards on 03/03/19 at 10:30 AM EDT by WebEx at home and verified that I am speaking with the correct person using two identifiers.   I discussed the limitations, risks, security and privacy concerns of performing an evaluation and management service by telephone and the availability of in person appointments. I also discussed with the patient that there may be a patient responsible charge related to this service. The patient expressed understanding and agreed to proceed. Subjective:  Nichole Edwards is a 33 y.o. G2P1001 at [redacted]w[redacted]d being seen today for ongoing prenatal care.  She is currently monitored for the following issues for this high-risk pregnancy and has Encounter for supervision of normal pregnancy, antepartum; Late prenatal care; Morbid obesity (HCC); Obesity in pregnancy; UTI in pregnancy, antepartum; Gestational diabetes mellitus; and Abnormal glucose tolerance test (GTT) during pregnancy, antepartum on their problem list.  Patient reports no complaints.  Reports fetal movement. Contractions: Not present. Vag. Bleeding: None.  Movement: Present. Denies any contractions, bleeding or leaking of fluid.   The following portions of the patient's history were reviewed and updated as appropriate: allergies, current medications, past family history, past medical history, past social history, past surgical history and problem list.   Objective:  There were no vitals filed for this visit.  Fetal Status:     Movement: Present     General:  Alert, oriented and cooperative. Patient is in no acute distress.  Respiratory: Normal respiratory effort, no problems with respiration noted  Mental Status: Normal mood and affect. Normal behavior. Normal judgment and thought content.  Rest of physical exam deferred due to type of encounter  Assessment and Plan:  Pregnancy: G2P1001 at [redacted]w[redacted]d 1. Supervision of high risk  pregnancy, antepartum  2. Diet controlled gestational diabetes mellitus (GDM) in third trimester - blood sugars well controlled  3. Obesity in pregnancy   Preterm labor symptoms and general obstetric precautions including but not limited to vaginal bleeding, contractions, leaking of fluid and fetal movement were reviewed in detail with the patient. I discussed the assessment and treatment plan with the patient. The patient was provided an opportunity to ask questions and all were answered. The patient agreed with the plan and demonstrated an understanding of the instructions. The patient was advised to call back or seek an in-person office evaluation/go to MAU at [facility] for any urgent or concerning symptoms. Please refer to After Visit Summary for other counseling recommendations.   I provided 10 minutes of face-to-face via WebEx time during this encounter.  Return in about 2 weeks (around 03/17/2019) for ROB.  Future Appointments  Date Time Provider Department Center  03/03/2019 10:30 AM Brock Bad, MD [facility] None  03/04/2019 12:15 PM Janene Harvey, Pascal Lux, NP PP-PIEDPED PP  03/19/2019 10:00 AM [facility]-M[facility] NURSE [facility]-MFC M[facility]-US  03/19/2019 10:00 AM [facility] [place] 3 WH-MFCUS MFC-US    Coral Ceo, MD Center for [facility], [facility] 03-03-2019

## 2019-03-04 ENCOUNTER — Ambulatory Visit (INDEPENDENT_AMBULATORY_CARE_PROVIDER_SITE_OTHER): Payer: Self-pay | Admitting: Pediatrics

## 2019-03-04 DIAGNOSIS — Z7681 Expectant parent(s) prebirth pediatrician visit: Secondary | ICD-10-CM

## 2019-03-04 NOTE — Progress Notes (Signed)
[redacted] weeks GA, prenatal care started around 15 weeks. Having a boy. Mom has gestational DM, controlled by diet and exercise. No BP issues.

## 2019-03-15 DIAGNOSIS — Z3482 Encounter for supervision of other normal pregnancy, second trimester: Secondary | ICD-10-CM | POA: Diagnosis not present

## 2019-03-17 ENCOUNTER — Ambulatory Visit (INDEPENDENT_AMBULATORY_CARE_PROVIDER_SITE_OTHER): Payer: Medicaid Other | Admitting: Obstetrics and Gynecology

## 2019-03-17 ENCOUNTER — Other Ambulatory Visit: Payer: Self-pay

## 2019-03-17 ENCOUNTER — Other Ambulatory Visit (HOSPITAL_COMMUNITY)
Admission: RE | Admit: 2019-03-17 | Discharge: 2019-03-17 | Disposition: A | Discharge status: Home / Self Care | Payer: Medicaid Other | Source: Ambulatory Visit | Attending: Obstetrics and Gynecology | Admitting: Obstetrics and Gynecology

## 2019-03-17 ENCOUNTER — Encounter: Payer: Self-pay | Admitting: Obstetrics and Gynecology

## 2019-03-17 VITALS — BP 114/80 | HR 103 | Temp 98.7°F | Wt 243.0 lb

## 2019-03-17 DIAGNOSIS — Z23 Encounter for immunization: Secondary | ICD-10-CM | POA: Diagnosis not present

## 2019-03-17 DIAGNOSIS — Z3480 Encounter for supervision of other normal pregnancy, unspecified trimester: Secondary | ICD-10-CM

## 2019-03-17 DIAGNOSIS — O24410 Gestational diabetes mellitus in pregnancy, diet controlled: Secondary | ICD-10-CM

## 2019-03-17 DIAGNOSIS — Z3A36 36 weeks gestation of pregnancy: Secondary | ICD-10-CM

## 2019-03-17 NOTE — Progress Notes (Signed)
Subjective:  Nichole Edwards is a 33 y.o. G2P1001 at [redacted]w[redacted]d being seen today for ongoing prenatal care.  She is currently monitored for the following issues for this high-risk pregnancy and has Encounter for supervision of normal pregnancy, antepartum; Late prenatal care; Morbid obesity (HCC); Obesity in pregnancy; UTI in pregnancy, antepartum; Gestational diabetes mellitus; and Abnormal glucose tolerance test (GTT) during pregnancy, antepartum on their problem list.  Patient reports general discomforts of pregnancy.  Contractions: Irritability. Vag. Bleeding: None.  Movement: Present. Denies leaking of fluid.   The following portions of the patient's history were reviewed and updated as appropriate: allergies, current medications, past family history, past medical history, past social history, past surgical history and problem list. Problem list updated.  Objective:   Vitals:   03/17/19 1355  BP: 114/80  Pulse: (!) 103  Temp: 98.7 F (37.1 C)  Weight: 243 lb (110.2 kg)    Fetal Status: Fetal Heart Rate (bpm): 138   Movement: Present     General:  Alert, oriented and cooperative. Patient is in no acute distress.  Skin: Skin is warm and dry. No rash noted.   Cardiovascular: Normal heart rate noted  Respiratory: Normal respiratory effort, no problems with respiration noted  Abdomen: Soft, gravid, appropriate for gestational age. Pain/Pressure: Absent     Pelvic:  Cervical exam performed        Extremities: Normal range of motion.  Edema: None  Mental Status: Normal mood and affect. Normal behavior. Normal judgment and thought content.   Urinalysis:      Assessment and Plan:  Pregnancy: G2P1001 at [redacted]w[redacted]d  1. Supervision of other normal pregnancy, antepartum Stable Labor precautions BP monitoring reviewed - Strep Gp B NAA - GC/Chlamydia probe amp ([place])not at [facility]  2. Need for Tdap vaccination  - Tdap vaccine greater than or equal to 7yo IM  3. Diet controlled gestational  diabetes mellitus (GDM) in third trimester CBG's in goal range Continue with diet Growth scan this Friday  Term labor symptoms and general obstetric precautions including but not limited to vaginal bleeding, contractions, leaking of fluid and fetal movement were reviewed in detail with the patient. Please refer to After Visit Summary for other counseling recommendations.  Return in about 2 weeks (around 03/31/2019) for OB visit, televisit.   Hermina Staggers, MD

## 2019-03-17 NOTE — Patient Instructions (Signed)
Third Trimester of Pregnancy The third trimester is from week 28 through week 40 (months 7 through 9). The third trimester is a time when the unborn baby (fetus) is growing rapidly. At the end of the ninth month, the fetus is about 20 inches in length and weighs 6-10 pounds. Body changes during your third trimester Your body will continue to go through many changes during pregnancy. The changes vary from woman to woman. During the third trimester:  Your weight will continue to increase. You can expect to gain 25-35 pounds (11-16 kg) by the end of the pregnancy.  You may begin to get stretch marks on your hips, abdomen, and breasts.  You may urinate more often because the fetus is moving lower into your pelvis and pressing on your bladder.  You may develop or continue to have heartburn. This is caused by increased hormones that slow down muscles in the digestive tract.  You may develop or continue to have constipation because increased hormones slow digestion and cause the muscles that push waste through your intestines to relax.  You may develop hemorrhoids. These are swollen veins (varicose veins) in the rectum that can itch or be painful.  You may develop swollen, bulging veins (varicose veins) in your legs.  You may have increased body aches in the pelvis, back, or thighs. This is due to weight gain and increased hormones that are relaxing your joints.  You may have changes in your hair. These can include thickening of your hair, rapid growth, and changes in texture. Some women also have hair loss during or after pregnancy, or hair that feels dry or thin. Your hair will most likely return to normal after your baby is born.  Your breasts will continue to grow and they will continue to become tender. A yellow fluid (colostrum) may leak from your breasts. This is the first milk you are producing for your baby.  Your belly button may stick out.  You may notice more swelling in your hands,  face, or ankles.  You may have increased tingling or numbness in your hands, arms, and legs. The skin on your belly may also feel numb.  You may feel short of breath because of your expanding uterus.  You may have more problems sleeping. This can be caused by the size of your belly, increased need to urinate, and an increase in your body's metabolism.  You may notice the fetus "dropping," or moving lower in your abdomen (lightening).  You may have increased vaginal discharge.  You may notice your joints feel loose and you may have pain around your pelvic bone. What to expect at prenatal visits You will have prenatal exams every 2 weeks until week 36. Then you will have weekly prenatal exams. During a routine prenatal visit:  You will be weighed to make sure you and the baby are growing normally.  Your blood pressure will be taken.  Your abdomen will be measured to track your baby's growth.  The fetal heartbeat will be listened to.  Any test results from the previous visit will be discussed.  You may have a cervical check near your due date to see if your cervix has softened or thinned (effaced).  You will be tested for Group B streptococcus. This happens between 35 and 37 weeks. Your health care provider may ask you:  What your birth plan is.  How you are feeling.  If you are feeling the baby move.  If you have had any abnormal   symptoms, such as leaking fluid, bleeding, severe headaches, or abdominal cramping.  If you are using any tobacco products, including cigarettes, chewing tobacco, and electronic cigarettes.  If you have any questions. Other tests or screenings that may be performed during your third trimester include:  Blood tests that check for low iron levels (anemia).  Fetal testing to check the health, activity level, and growth of the fetus. Testing is done if you have certain medical conditions or if there are problems during the pregnancy.  Nonstress test  (NST). This test checks the health of your baby to make sure there are no signs of problems, such as the baby not getting enough oxygen. During this test, a belt is placed around your belly. The baby is made to move, and its heart rate is monitored during movement. What is false labor? False labor is a condition in which you feel small, irregular tightenings of the muscles in the womb (contractions) that usually go away with rest, changing position, or drinking water. These are called Braxton Hicks contractions. Contractions may last for hours, days, or even weeks before true labor sets in. If contractions come at regular intervals, become more frequent, increase in intensity, or become painful, you should see your health care provider. What are the signs of labor?  Abdominal cramps.  Regular contractions that start at 10 minutes apart and become stronger and more frequent with time.  Contractions that start on the top of the uterus and spread down to the lower abdomen and back.  Increased pelvic pressure and dull back pain.  A watery or bloody mucus discharge that comes from the vagina.  Leaking of amniotic fluid. This is also known as your "water breaking." It could be a slow trickle or a gush. Let your health care provider know if it has a color or strange odor. If you have any of these signs, call your health care provider right away, even if it is before your due date. Follow these instructions at home: Medicines  Follow your health care provider's instructions regarding medicine use. Specific medicines may be either safe or unsafe to take during pregnancy.  Take a prenatal vitamin that contains at least 600 micrograms (mcg) of folic acid.  If you develop constipation, try taking a stool softener if your health care provider approves. Eating and drinking   Eat a balanced diet that includes fresh fruits and vegetables, whole grains, good sources of protein such as meat, eggs, or tofu,  and low-fat dairy. Your health care provider will help you determine the amount of weight gain that is right for you.  Avoid raw meat and uncooked cheese. These carry germs that can cause birth defects in the baby.  If you have low calcium intake from food, talk to your health care provider about whether you should take a daily calcium supplement.  Eat four or five small meals rather than three large meals a day.  Limit foods that are high in fat and processed sugars, such as fried and sweet foods.  To prevent constipation: ? Drink enough fluid to keep your urine clear or pale yellow. ? Eat foods that are high in fiber, such as fresh fruits and vegetables, whole grains, and beans. Activity  Exercise only as directed by your health care provider. Most women can continue their usual exercise routine during pregnancy. Try to exercise for 30 minutes at least 5 days a week. Stop exercising if you experience uterine contractions.  Avoid heavy lifting.  Do   not exercise in extreme heat or humidity, or at high altitudes.  Wear low-heel, comfortable shoes.  Practice good posture.  You may continue to have sex unless your health care provider tells you otherwise. Relieving pain and discomfort  Take frequent breaks and rest with your legs elevated if you have leg cramps or low back pain.  Take warm sitz baths to soothe any pain or discomfort caused by hemorrhoids. Use hemorrhoid cream if your health care provider approves.  Wear a good support bra to prevent discomfort from breast tenderness.  If you develop varicose veins: ? Wear support pantyhose or compression stockings as told by your healthcare provider. ? Elevate your feet for 15 minutes, 3-4 times a day. Prenatal care  Write down your questions. Take them to your prenatal visits.  Keep all your prenatal visits as told by your health care provider. This is important. Safety  Wear your seat belt at all times when driving.  Make  a list of emergency phone numbers, including numbers for family, friends, the hospital, and police and fire departments. General instructions  Avoid cat litter boxes and soil used by cats. These carry germs that can cause birth defects in the baby. If you have a cat, ask someone to clean the litter box for you.  Do not travel far distances unless it is absolutely necessary and only with the approval of your health care provider.  Do not use hot tubs, steam rooms, or saunas.  Do not drink alcohol.  Do not use any products that contain nicotine or tobacco, such as cigarettes and e-cigarettes. If you need help quitting, ask your health care provider.  Do not use any medicinal herbs or unprescribed drugs. These chemicals affect the formation and growth of the baby.  Do not douche or use tampons or scented sanitary pads.  Do not cross your legs for long periods of time.  To prepare for the arrival of your baby: ? Take prenatal classes to understand, practice, and ask questions about labor and delivery. ? Make a trial run to the hospital. ? Visit the hospital and tour the maternity area. ? Arrange for maternity or paternity leave through employers. ? Arrange for family and friends to take care of pets while you are in the hospital. ? Purchase a rear-facing car seat and make sure you know how to install it in your car. ? Pack your hospital bag. ? Prepare the baby's nursery. Make sure to remove all pillows and stuffed animals from the baby's crib to prevent suffocation.  Visit your dentist if you have not gone during your pregnancy. Use a soft toothbrush to brush your teeth and be gentle when you floss. Contact a health care provider if:  You are unsure if you are in labor or if your water has broken.  You become dizzy.  You have mild pelvic cramps, pelvic pressure, or nagging pain in your abdominal area.  You have lower back pain.  You have persistent nausea, vomiting, or  diarrhea.  You have an unusual or bad smelling vaginal discharge.  You have pain when you urinate. Get help right away if:  Your water breaks before 37 weeks.  You have regular contractions less than 5 minutes apart before 37 weeks.  You have a fever.  You are leaking fluid from your vagina.  You have spotting or bleeding from your vagina.  You have severe abdominal pain or cramping.  You have rapid weight loss or weight gain.  You have   shortness of breath with chest pain.  You notice sudden or extreme swelling of your face, hands, ankles, feet, or legs.  Your baby makes fewer than 10 movements in 2 hours.  You have severe headaches that do not go away when you take medicine.  You have vision changes. Summary  The third trimester is from week 28 through week 40, months 7 through 9. The third trimester is a time when the unborn baby (fetus) is growing rapidly.  During the third trimester, your discomfort may increase as you and your baby continue to gain weight. You may have abdominal, leg, and back pain, sleeping problems, and an increased need to urinate.  During the third trimester your breasts will keep growing and they will continue to become tender. A yellow fluid (colostrum) may leak from your breasts. This is the first milk you are producing for your baby.  False labor is a condition in which you feel small, irregular tightenings of the muscles in the womb (contractions) that eventually go away. These are called Braxton Hicks contractions. Contractions may last for hours, days, or even weeks before true labor sets in.  Signs of labor can include: abdominal cramps; regular contractions that start at 10 minutes apart and become stronger and more frequent with time; watery or bloody mucus discharge that comes from the vagina; increased pelvic pressure and dull back pain; and leaking of amniotic fluid. This information is not intended to replace advice given to you by your  health care provider. Make sure you discuss any questions you have with your health care provider. Document Released: 10/08/2001 Document Revised: 11/19/2016 Document Reviewed: 11/19/2016 Elsevier Interactive Patient Education  2019 [employer]vier Inc[employer]  

## 2019-03-18 LAB — GC/CHLAMYDIA PROBE AMP (~~LOC~~) NOT AT ARMC
Chlamydia: NEGATIVE
Neisseria Gonorrhea: NEGATIVE

## 2019-03-19 ENCOUNTER — Other Ambulatory Visit: Payer: Self-pay

## 2019-03-19 ENCOUNTER — Encounter (HOSPITAL_COMMUNITY): Payer: Self-pay

## 2019-03-19 ENCOUNTER — Ambulatory Visit (HOSPITAL_COMMUNITY)
Admission: RE | Admit: 2019-03-19 | Discharge: 2019-03-19 | Disposition: A | Discharge status: Home / Self Care | Payer: Medicaid Other | Source: Ambulatory Visit | Attending: Obstetrics and Gynecology | Admitting: Obstetrics and Gynecology

## 2019-03-19 ENCOUNTER — Ambulatory Visit (HOSPITAL_COMMUNITY): Payer: Medicaid Other | Admitting: *Deleted

## 2019-03-19 VITALS — BP 114/66 | HR 89 | Temp 97.9°F

## 2019-03-19 DIAGNOSIS — Z362 Encounter for other antenatal screening follow-up: Secondary | ICD-10-CM | POA: Diagnosis not present

## 2019-03-19 DIAGNOSIS — Z3A36 36 weeks gestation of pregnancy: Secondary | ICD-10-CM | POA: Diagnosis not present

## 2019-03-19 DIAGNOSIS — O24410 Gestational diabetes mellitus in pregnancy, diet controlled: Secondary | ICD-10-CM | POA: Diagnosis not present

## 2019-03-19 LAB — STREP GP B NAA: Strep Gp B NAA: NEGATIVE

## 2019-03-31 ENCOUNTER — Ambulatory Visit (INDEPENDENT_AMBULATORY_CARE_PROVIDER_SITE_OTHER): Payer: Medicaid Other | Admitting: Obstetrics

## 2019-03-31 ENCOUNTER — Other Ambulatory Visit: Payer: Self-pay

## 2019-03-31 ENCOUNTER — Encounter: Payer: Self-pay | Admitting: Obstetrics

## 2019-03-31 VITALS — BP 116/82 | HR 87

## 2019-03-31 DIAGNOSIS — O99213 Obesity complicating pregnancy, third trimester: Secondary | ICD-10-CM

## 2019-03-31 DIAGNOSIS — O24410 Gestational diabetes mellitus in pregnancy, diet controlled: Secondary | ICD-10-CM

## 2019-03-31 DIAGNOSIS — O0990 Supervision of high risk pregnancy, unspecified, unspecified trimester: Secondary | ICD-10-CM

## 2019-03-31 DIAGNOSIS — O0993 Supervision of high risk pregnancy, unspecified, third trimester: Secondary | ICD-10-CM

## 2019-03-31 DIAGNOSIS — O0933 Supervision of pregnancy with insufficient antenatal care, third trimester: Secondary | ICD-10-CM

## 2019-03-31 DIAGNOSIS — Z3A38 38 weeks gestation of pregnancy: Secondary | ICD-10-CM

## 2019-03-31 DIAGNOSIS — O99210 Obesity complicating pregnancy, unspecified trimester: Secondary | ICD-10-CM

## 2019-03-31 DIAGNOSIS — O0930 Supervision of pregnancy with insufficient antenatal care, unspecified trimester: Secondary | ICD-10-CM

## 2019-03-31 NOTE — Progress Notes (Signed)
Webex ROB FBS 83, after meals 108 Pt has questions about recent u/s. Pressure is increasing

## 2019-03-31 NOTE — Progress Notes (Signed)
TELEHEALTH OBSTETRICS PRENATAL VIRTUAL VIDEO VISIT ENCOUNTER NOTE  Provider location: [facility] [employer] [place]   I connected with [name] on 03/31/19 at 10:15 AM EDT by WebEx OB MyChart Video Encounter at home and verified that I am speaking with the correct person using two identifiers.   I discussed the limitations, risks, security and privacy concerns of performing an evaluation and management service by telephone and the availability of in person appointments. I also discussed with the patient that there may be a patient responsible charge related to this service. The patient expressed understanding and agreed to proceed. Subjective:  Nichole Edwards is a 33 y.o. G2P1001 at [redacted]w[redacted]d being seen today for ongoing prenatal care.  She is currently monitored for the following issues for this high-risk pregnancy and has Encounter for supervision of normal pregnancy, antepartum; Late prenatal care; Morbid obesity (HCC); Obesity in pregnancy; UTI in pregnancy, antepartum; Gestational diabetes mellitus; and Abnormal glucose tolerance test (GTT) during pregnancy, antepartum on their problem list.  Patient reports no complaints.  Contractions: Not present. Vag. Bleeding: None.  Movement: Present. Denies any leaking of fluid.   The following portions of the patient's history were reviewed and updated as appropriate: allergies, current medications, past family history, past medical history, past social history, past surgical history and problem list.   Objective:   Vitals:   03/31/19 0920  BP: 116/82  Pulse: 87    Fetal Status:     Movement: Present     General:  Alert, oriented and cooperative. Patient is in no acute distress.  Respiratory: Normal respiratory effort, no problems with respiration noted  Mental Status: Normal mood and affect. Normal behavior. Normal judgment and thought content.  Rest of physical exam deferred due to type of encounter  Imaging: [place] Mfm Ob  Follow Up  Result Date: 03/19/2019 ----------------------------------------------------------------------  OBSTETRICS REPORT                       (Signed Final 03/19/2019 11:16 am) ---------------------------------------------------------------------- Patient Info  ID #:       [id number]                          D.O.B.:  1986-01-06 (33 yrs)  Name:       Nichole Edwards                   Visit Date: 03/19/2019 09:57 am ---------------------------------------------------------------------- Performed By  Performed By:     Rennie Plowman          Ref. Address:     [address]                    [place]                                                             [address]  Attending:        Noralee Space MD        [facility]  Referred By:      [facility] ---------------------------------------------------------------------- Orders   #  Description                          Code         Ordered By   1  [place] MFM OB FOLLOW UP                  40981.19     Lin Landsman  ----------------------------------------------------------------------   #  Order #                    Accession #                 Episode #   1  [id number]                  [id number]                  [id number]  ---------------------------------------------------------------------- Indications   [redacted] weeks gestation of pregnancy                Z3A.36   Obesity complicating pregnancy, third   trimester   Gestational diabetes in pregnancy, diet   controlled  ---------------------------------------------------------------------- Vital Signs  Weight (lb): 243                               Height:        5'3"  BMI:         43.04 ---------------------------------------------------------------------- Fetal Evaluation  Num Of Fetuses:         1  Fetal Heart Rate(bpm):  134  Cardiac Activity:       Observed  Presentation:            Cephalic  Placenta:               Anterior  P. Cord Insertion:      Visualized, central  AFI Sum(cm)     %Tile       Largest Pocket(cm)  14.61           54          5.3  RUQ(cm)       RLQ(cm)       LUQ(cm)        LLQ(cm)  2.64          5.3           3.82           2.85 ---------------------------------------------------------------------- Biometry  BPD:      89.6  mm     G. Age:  36w 2d         57  %    CI:        73.83   %    70 - 86  FL/HC:      21.3   %    20.1 - 22.1  HC:      331.2  mm     G. Age:  37w 5d         54  %    HC/AC:      1.05        0.93 - 1.11  AC:      316.6  mm     G. Age:  35w 4d         38  %    FL/BPD:     78.6   %    71 - 87  FL:       70.4  mm     G. Age:  36w 1d         39  %    FL/AC:      22.2   %    20 - 24  Est. FW:    2829  gm      6 lb 4 oz     57  % ---------------------------------------------------------------------- OB History  Gravidity:    2         Term:   1        Prem:   0        SAB:   0  TOP:          0       Ectopic:  0        Living: 1 ---------------------------------------------------------------------- Gestational Age  LMP:           36w 3d        Date:  07/07/18                 EDD:   04/13/19  U/S Today:     36w 3d                                        EDD:   04/13/19  Best:          36w 3d     Det. By:  LMP  (07/07/18)          EDD:   04/13/19 ---------------------------------------------------------------------- Anatomy  Cranium:               Appears normal         Aortic Arch:            Previously seen  Cavum:                 Appears normal         Ductal Arch:            Previously seen  Ventricles:            Appears normal         Diaphragm:              Previously seen  Choroid Plexus:        Appears normal         Stomach:                Appears normal, left  sided  Cerebellum:            Appears normal         Abdomen:                 Appears normal  Posterior Fossa:       Appears normal         Abdominal Wall:         Previously seen  Nuchal Fold:           Previously seen        Cord Vessels:           Appears normal (3                                                                        vessel cord)  Face:                  Orbits and profile     Kidneys:                Appear normal                         previously seen  Lips:                  Previously seen        Bladder:                Appears normal  Thoracic:              Appears normal         Spine:                  Previously seen  Heart:                 Appears normal         Upper Extremities:      Previously seen                         (4CH, axis, and                         situs)  RVOT:                  Previously seen        Lower Extremities:      Previously seen  LVOT:                  Previously seen  Other:  Heels and 5th digit previously visualized. Fetus appears to be a female.          Nasal bone visualized. ---------------------------------------------------------------------- Cervix Uterus Adnexa  Cervix  Not visualized (advanced GA >24wks)  Left Ovary  Within normal limits.  Right Ovary  Within normal limits.  Adnexa  No abnormality visualized. ---------------------------------------------------------------------- Impression  Gestational diabetes.  Well-controlled on diet. Fetal growth is appropriate for  gestational age. Amniotic fluid is normal and good fetal  activity is seen. ---------------------------------------------------------------------- Recommendations  Follow-up as clinically indicated. ----------------------------------------------------------------------  Noralee Space, MD Electronically Signed Final Report   03/19/2019 11:16 am ----------------------------------------------------------------------   Assessment and Plan:  Pregnancy: G2P1001 at [redacted]w[redacted]d 1. Supervision of high risk pregnancy, antepartum  2. Diet controlled  gestational diabetes mellitus (GDM) in third trimester - glucose well controlled - appropriate fetal growth on ultrasound at 36 weeks ( 57th percentile )  3. Obesity in pregnancy  4. Late prenatal care   Preterm labor symptoms and general obstetric precautions including but not limited to vaginal bleeding, contractions, leaking of fluid and fetal movement were reviewed in detail with the patient. I discussed the assessment and treatment plan with the patient. The patient was provided an opportunity to ask questions and all were answered. The patient agreed with the plan and demonstrated an understanding of the instructions. The patient was advised to call back or seek an in-person office evaluation/go to [facility] at [facility] for any urgent or concerning symptoms. Please refer to After Visit Summary for other counseling recommendations.   I provided 10 minutes of face-to-face time during this encounter.  Return in about 1 week (around 04/07/2019) for ROB.  Future Appointments  Date Time Provider Department Center  03/31/2019 10:15 AM Brock Bad, MD [facility] None    Coral Ceo, MD [facility], [facility] 03-31-2019

## 2019-04-01 ENCOUNTER — Telehealth (HOSPITAL_COMMUNITY): Payer: Self-pay | Admitting: *Deleted

## 2019-04-01 ENCOUNTER — Encounter (HOSPITAL_COMMUNITY): Payer: Self-pay | Admitting: *Deleted

## 2019-04-01 NOTE — Telephone Encounter (Signed)
Preadmission screen  

## 2019-04-06 ENCOUNTER — Inpatient Hospital Stay (HOSPITAL_COMMUNITY): Payer: Medicaid Other | Admitting: Anesthesiology

## 2019-04-06 ENCOUNTER — Encounter (HOSPITAL_COMMUNITY): Payer: Self-pay

## 2019-04-06 ENCOUNTER — Other Ambulatory Visit: Payer: Self-pay

## 2019-04-06 ENCOUNTER — Inpatient Hospital Stay (HOSPITAL_COMMUNITY)
Admission: AD | Admit: 2019-04-06 | Discharge: 2019-04-08 | DRG: 807 | Disposition: A | Discharge status: Home / Self Care | Payer: Medicaid Other | Attending: Obstetrics and Gynecology | Admitting: Obstetrics and Gynecology

## 2019-04-06 DIAGNOSIS — O24429 Gestational diabetes mellitus in childbirth, unspecified control: Secondary | ICD-10-CM | POA: Diagnosis not present

## 2019-04-06 DIAGNOSIS — O24410 Gestational diabetes mellitus in pregnancy, diet controlled: Secondary | ICD-10-CM

## 2019-04-06 DIAGNOSIS — O24420 Gestational diabetes mellitus in childbirth, diet controlled: Principal | ICD-10-CM | POA: Diagnosis present

## 2019-04-06 DIAGNOSIS — O99214 Obesity complicating childbirth: Secondary | ICD-10-CM | POA: Diagnosis present

## 2019-04-06 DIAGNOSIS — Z3A39 39 weeks gestation of pregnancy: Secondary | ICD-10-CM

## 2019-04-06 DIAGNOSIS — O24419 Gestational diabetes mellitus in pregnancy, unspecified control: Secondary | ICD-10-CM | POA: Diagnosis present

## 2019-04-06 DIAGNOSIS — Z1159 Encounter for screening for other viral diseases: Secondary | ICD-10-CM | POA: Diagnosis not present

## 2019-04-06 DIAGNOSIS — O2340 Unspecified infection of urinary tract in pregnancy, unspecified trimester: Secondary | ICD-10-CM | POA: Diagnosis present

## 2019-04-06 DIAGNOSIS — O0930 Supervision of pregnancy with insufficient antenatal care, unspecified trimester: Secondary | ICD-10-CM

## 2019-04-06 DIAGNOSIS — O26893 Other specified pregnancy related conditions, third trimester: Secondary | ICD-10-CM | POA: Diagnosis present

## 2019-04-06 LAB — GLUCOSE, CAPILLARY
Glucose-Capillary: 104 mg/dL — ABNORMAL HIGH (ref 70–99)
Glucose-Capillary: 95 mg/dL (ref 70–99)
Glucose-Capillary: 71 mg/dL (ref 70–99)
Glucose-Capillary: 62 mg/dL — ABNORMAL LOW (ref 70–99)

## 2019-04-06 LAB — SARS CORONAVIRUS 2: SARS Coronavirus 2: NOT DETECTED

## 2019-04-06 LAB — CBC
HCT: 33.8 % — ABNORMAL LOW (ref 36.0–46.0)
Hemoglobin: 11.3 g/dL — ABNORMAL LOW (ref 12.0–15.0)
MCH: 31.9 pg (ref 26.0–34.0)
MCHC: 33.4 g/dL (ref 30.0–36.0)
MCV: 95.5 fL (ref 80.0–100.0)
Platelets: 180 10*3/uL (ref 150–400)
RBC: 3.54 MIL/uL — ABNORMAL LOW (ref 3.87–5.11)
RDW: 13.2 % (ref 11.5–15.5)
WBC: 12.3 10*3/uL — ABNORMAL HIGH (ref 4.0–10.5)
nRBC: 0 % (ref 0.0–0.2)

## 2019-04-06 LAB — TYPE AND SCREEN
ABO/RH(D): O POS
Antibody Screen: NEGATIVE

## 2019-04-06 LAB — RPR: RPR Ser Ql: NONREACTIVE

## 2019-04-06 MED ORDER — ONDANSETRON HCL 4 MG/2ML IJ SOLN: 4.0000 mg | INTRAMUSCULAR | Status: DC | PRN

## 2019-04-06 MED ORDER — EPHEDRINE 5 MG/ML INJ: 10.0000 mg | INTRAVENOUS | Status: DC | PRN

## 2019-04-06 MED ORDER — DIPHENHYDRAMINE HCL 50 MG/ML IJ SOLN: 12.5000 mg | INTRAMUSCULAR | Status: DC | PRN

## 2019-04-06 MED ORDER — LIDOCAINE HCL (PF) 1 % IJ SOLN: 30.0000 mL | INTRAMUSCULAR | Status: DC | PRN

## 2019-04-06 MED ORDER — IBUPROFEN 600 MG PO TABS
600.0000 mg | ORAL_TABLET | Freq: Four times a day (QID) | ORAL | Status: DC
  Administered 2019-04-06 – 2019-04-08 (×7): 600 mg via ORAL
  Filled 2019-04-06 (×7): qty 1

## 2019-04-06 MED ORDER — PHENYLEPHRINE 40 MCG/ML (10ML) SYRINGE FOR IV PUSH (FOR BLOOD PRESSURE SUPPORT): 80.0000 ug | PREFILLED_SYRINGE | INTRAVENOUS | Status: DC | PRN

## 2019-04-06 MED ORDER — COCONUT OIL OIL: 1.0000 "application " | TOPICAL_OIL | Status: DC | PRN

## 2019-04-06 MED ORDER — MAGNESIUM HYDROXIDE 400 MG/5ML PO SUSP: 30.0000 mL | ORAL | Status: DC | PRN

## 2019-04-06 MED ORDER — FENTANYL-BUPIVACAINE-NACL 0.5-0.125-0.9 MG/250ML-% EP SOLN: 12.0000 mL/h | EPIDURAL | Status: DC | PRN

## 2019-04-06 MED ORDER — FENTANYL CITRATE (PF) 100 MCG/2ML IJ SOLN: 100.0000 ug | INTRAMUSCULAR | Status: DC | PRN

## 2019-04-06 MED ORDER — OXYCODONE-ACETAMINOPHEN 5-325 MG PO TABS: 1.0000 | ORAL_TABLET | ORAL | Status: DC | PRN

## 2019-04-06 MED ORDER — DIBUCAINE (PERIANAL) 1 % EX OINT: 1.0000 "application " | TOPICAL_OINTMENT | CUTANEOUS | Status: DC | PRN

## 2019-04-06 MED ORDER — FENTANYL-BUPIVACAINE-NACL 0.5-0.125-0.9 MG/250ML-% EP SOLN
12.0000 mL/h | EPIDURAL | Status: DC | PRN
  Filled 2019-04-06: qty 250

## 2019-04-06 MED ORDER — LIDOCAINE HCL (PF) 1 % IJ SOLN: INTRAMUSCULAR | Status: DC | PRN

## 2019-04-06 MED ORDER — FERROUS SULFATE 325 (65 FE) MG PO TABS
325.0000 mg | ORAL_TABLET | Freq: Two times a day (BID) | ORAL | Status: DC
  Administered 2019-04-06 – 2019-04-08 (×4): 325 mg via ORAL
  Filled 2019-04-06 (×4): qty 1

## 2019-04-06 MED ORDER — LACTATED RINGERS IV SOLN
INTRAVENOUS | Status: DC
  Administered 2019-04-06 (×2): via INTRAVENOUS

## 2019-04-06 MED ORDER — OXYCODONE-ACETAMINOPHEN 5-325 MG PO TABS: 2.0000 | ORAL_TABLET | ORAL | Status: DC | PRN

## 2019-04-06 MED ORDER — ONDANSETRON HCL 4 MG PO TABS: 4.0000 mg | ORAL_TABLET | ORAL | Status: DC | PRN

## 2019-04-06 MED ORDER — LACTATED RINGERS IV SOLN: 500.0000 mL | Freq: Once | INTRAVENOUS | Status: DC

## 2019-04-06 MED ORDER — FLEET ENEMA 7-19 GM/118ML RE ENEM: 1.0000 | ENEMA | RECTAL | Status: DC | PRN

## 2019-04-06 MED ORDER — DIPHENHYDRAMINE HCL 25 MG PO CAPS: 25.0000 mg | ORAL_CAPSULE | Freq: Four times a day (QID) | ORAL | Status: DC | PRN

## 2019-04-06 MED ORDER — SIMETHICONE 80 MG PO CHEW: 80.0000 mg | CHEWABLE_TABLET | ORAL | Status: DC | PRN

## 2019-04-06 MED ORDER — ONDANSETRON HCL 4 MG/2ML IJ SOLN: 4.0000 mg | Freq: Four times a day (QID) | INTRAMUSCULAR | Status: DC | PRN

## 2019-04-06 MED ORDER — OXYTOCIN BOLUS FROM INFUSION
500.0000 mL | Freq: Once | INTRAVENOUS | Status: AC
  Administered 2019-04-06: 500 mL via INTRAVENOUS

## 2019-04-06 MED ORDER — SODIUM CHLORIDE (PF) 0.9 % IJ SOLN
INTRAMUSCULAR | Status: DC | PRN
  Administered 2019-04-06: 12 mL/h via EPIDURAL

## 2019-04-06 MED ORDER — WITCH HAZEL-GLYCERIN EX PADS: 1.0000 "application " | MEDICATED_PAD | CUTANEOUS | Status: DC | PRN

## 2019-04-06 MED ORDER — LACTATED RINGERS IV SOLN: 500.0000 mL | INTRAVENOUS | Status: DC | PRN

## 2019-04-06 MED ORDER — LIDOCAINE HCL (PF) 1 % IJ SOLN
INTRAMUSCULAR | Status: DC | PRN
  Administered 2019-04-06: 6 mL

## 2019-04-06 MED ORDER — OXYTOCIN 40 UNITS IN NORMAL SALINE INFUSION - SIMPLE MED
1.0000 m[IU]/min | INTRAVENOUS | Status: DC
  Administered 2019-04-06: 2 m[IU]/min via INTRAVENOUS

## 2019-04-06 MED ORDER — ACETAMINOPHEN 325 MG PO TABS: 650.0000 mg | ORAL_TABLET | ORAL | Status: DC | PRN

## 2019-04-06 MED ORDER — BENZOCAINE-MENTHOL 20-0.5 % EX AERO
1.0000 "application " | INHALATION_SPRAY | CUTANEOUS | Status: DC | PRN
  Filled 2019-04-06: qty 56

## 2019-04-06 MED ORDER — OXYTOCIN 40 UNITS IN NORMAL SALINE INFUSION - SIMPLE MED
2.5000 [IU]/h | INTRAVENOUS | Status: DC
  Filled 2019-04-06: qty 1000

## 2019-04-06 MED ORDER — TETANUS-DIPHTH-ACELL PERTUSSIS 5-2.5-18.5 LF-MCG/0.5 IM SUSP: 0.5000 mL | Freq: Once | INTRAMUSCULAR | Status: DC

## 2019-04-06 MED ORDER — MEASLES, MUMPS & RUBELLA VAC IJ SOLR: 0.5000 mL | Freq: Once | INTRAMUSCULAR | Status: DC

## 2019-04-06 MED ORDER — SOD CITRATE-CITRIC ACID 500-334 MG/5ML PO SOLN: 30.0000 mL | ORAL | Status: DC | PRN

## 2019-04-06 MED ORDER — LACTATED RINGERS IV SOLN
500.0000 mL | Freq: Once | INTRAVENOUS | Status: AC
  Administered 2019-04-06: 500 mL via INTRAVENOUS

## 2019-04-06 MED ORDER — ZOLPIDEM TARTRATE 5 MG PO TABS: 5.0000 mg | ORAL_TABLET | Freq: Every evening | ORAL | Status: DC | PRN

## 2019-04-06 MED ORDER — PRENATAL MULTIVITAMIN CH
1.0000 | ORAL_TABLET | Freq: Every day | ORAL | Status: DC
  Administered 2019-04-07: 1 via ORAL
  Filled 2019-04-06: qty 1

## 2019-04-06 NOTE — Progress Notes (Signed)
Patient ID: Nichole Edwards, female   DOB: Feb 10, 1986, 33 y.o.   MRN: [id number] Nichole Edwards is a 33 y.o. G2P1001 at [redacted]w[redacted]d.  Subjective: Comfortable w/ Epidural.   Objective: BP (!) 98/47   Pulse 68   Temp 98.6 F (37 C) (Oral)   Resp 18   Ht 5\' 3"  (1.6 m)   Wt 111.7 kg   LMP 07/07/2018 (Within Days)   SpO2 99%   BMI 43.63 kg/m    FHT:  FHR: 135 bpm, variability: mod,  accelerations:  15x15,  decelerations:  none UC:  irreg, mod Dilation: 7 Effacement (%): 80 Station: -2 Presentation: Vertex Exam by:: [place], rn  SROM large amount of clear fluid  Labs: Results for orders placed or performed during the hospital encounter of 04/06/19 (from the past 24 hour(s))  SARS Coronavirus 2     Status: None   Collection Time: 04/06/19  3:21 AM  Result Value Ref Range   SARS Coronavirus 2 NOT DETECTED NOT DETECTED  CBC     Status: Abnormal   Collection Time: 04/06/19  3:23 AM  Result Value Ref Range   WBC 12.3 (H) 4.0 - 10.5 K/uL   RBC 3.54 (L) 3.87 - 5.11 MIL/uL   Hemoglobin 11.3 (L) 12.0 - 15.0 g/dL   HCT 33.8 (L) 36.0 - 46.0 %   MCV 95.5 80.0 - 100.0 fL   MCH 31.9 26.0 - 34.0 pg   MCHC 33.4 30.0 - 36.0 g/dL   RDW 13.2 11.5 - 15.5 %   Platelets 180 150 - 400 K/uL   nRBC 0.0 0.0 - 0.2 %  Type and screen [place]     Status: None   Collection Time: 04/06/19  3:23 AM  Result Value Ref Range   ABO/RH(D) O POS    Antibody Screen NEG    Sample Expiration      04/09/2019,2359 Performed at [facility], [place] [address] [place], [place] [address]   ABO/Rh     Status: None (Preliminary result)   Collection Time: 04/06/19  3:23 AM  Result Value Ref Range   ABO/RH(D)      O POS Performed at [place] [address] [place], [place] [address]   Glucose, capillary     Status: Abnormal   Collection Time: 04/06/19  3:45 AM  Result Value Ref Range   Glucose-Capillary 104 (H) 70 - 99 mg/dL  Glucose, capillary     Status: None   Collection Time:  04/06/19  6:41 AM  Result Value Ref Range   Glucose-Capillary 95 70 - 99 mg/dL  Glucose, capillary     Status: None   Collection Time: 04/06/19  8:57 AM  Result Value Ref Range   Glucose-Capillary 71 70 - 99 mg/dL    Assessment / Plan: [redacted]w[redacted]d week IUP Labor: Transition Fetal Wellbeing:  Category I Pain Control:  Epid Anticipated MOD:  SVD Pitocin started  [place], [place], [place] 04/06/2019 9:12 AM

## 2019-04-06 NOTE — Discharge Summary (Signed)
  Postpartum Discharge Summary     Patient Name: Nichole Edwards DOB: 11/03/1985 MRN: [id number]  Date of admission: 04/06/2019 Delivering Provider: SMITH, [place]   Date of discharge: 04/08/2019  Admitting diagnosis: preg Intrauterine pregnancy: [redacted]w[redacted]d     Secondary diagnosis:  Principal Problem:   Labor and delivery, indication for care Active Problems:   Late prenatal care   Morbid obesity (HCC)   UTI in pregnancy, antepartum   Gestational diabetes mellitus   Vaginal delivery  Additional problems: none     Discharge diagnosis: Term Pregnancy Delivered and GDM A1                                                                                                Post partum procedures:none  Augmentation: Pitocin  Complications: None  Hospital course:  Onset of Labor With Vaginal Delivery     33 y.o. yo G2P1001 at [redacted]w[redacted]d was admitted in Active Labor on 04/06/2019. Patient had an uncomplicated labor course as follows:  Membrane Rupture Time/Date: 8:01 AM ,04/06/2019   Intrapartum Procedures: Episiotomy: None [1]                                         Lacerations:  None [1]  Patient had a delivery of a Viable infant. 04/06/2019  Information for the patient's newborn:  Burright, Boy Zera [[id number]]  Delivery Method: Vaginal, Spontaneous(Filed from Delivery Summary)     Pateint had an uncomplicated postpartum course.  She is ambulating, tolerating a regular diet, passing flatus, and urinating well. Patient is discharged home in stable condition on 04/08/19.   Magnesium Sulfate recieved: No BMZ received: No  Physical exam  Vitals:   04/07/19 0532 04/07/19 1352 04/07/19 2157 04/08/19 0522  BP: 120/81 120/78 134/80 117/82  Pulse: 83 77 75 67  Resp: 18 19  18  Temp: 98 F (36.7 C) 98.3 F (36.8 C) 98.2 F (36.8 C) 97.7 F (36.5 C)  TempSrc:  Oral Oral Oral  SpO2: 100%  100% 99%  Weight:      Height:       General: alert, cooperative and no distress Lochia:  appropriate Uterine Fundus: firm Incision: N/A DVT Evaluation: No evidence of DVT seen on physical exam. Labs: Lab Results  Component Value Date   WBC 12.3 (H) 04/06/2019   HGB 11.3 (L) 04/06/2019   HCT 33.8 (L) 04/06/2019   MCV 95.5 04/06/2019   PLT 180 04/06/2019   CMP Latest Ref Rng & Units 11/18/2018  Glucose 65 - 99 mg/dL 94  BUN 6 - 20 mg/dL 5(L)  Creatinine 0.57 - 1.00 mg/dL 0.58  Sodium 134 - 144 mmol/L 139  Potassium 3.5 - 5.2 mmol/L 4.0  Chloride 96 - 106 mmol/L 102  CO2 20 - 29 mmol/L 22  Calcium 8.7 - 10.2 mg/dL 9.3  Total Protein 6.0 - 8.5 g/dL 6.3  Total Bilirubin 0.0 - 1.2 mg/dL <0.2  Alkaline Phos 39 - 117 IU/L 53  AST 0 - 40 IU/L 10  ALT   0 - 32 IU/L 8    Discharge instruction: per After Visit Summary and "Baby and Me Booklet".  After visit meds:  Allergies as of 04/08/2019   No Known Allergies     Medication List    TAKE these medications   Accu-Chek FastClix Lancets Misc 1 Device by Percutaneous route 4 (four) times daily.   Accu-Chek Guide w/Device Kit 1 Device by Does not apply route 4 (four) times daily.   Blood Pressure Monitor/S Cuff Misc PLACE CUFF ON ARM TO CHECK BP DAILY   glucose blood test strip Commonly known as: Accu-Chek Guide Use to check blood sugars four times a day was instructed   ibuprofen 600 MG tablet Commonly known as: ADVIL Take 1 tablet (600 mg total) by mouth every 6 (six) hours.   PRENATAL VITAMINS PO Take by mouth.       Diet: carb modified diet  Activity: Advance as tolerated. Pelvic rest for 6 weeks.   Outpatient follow up:6 weeks. Needs 2 hour GTT Follow up Appt: Future Appointments  Date Time Provider [place]  05/04/2019  9:15 AM Wood[place] Mode, MD [facility] None  05/18/2019  9:00 AM [facility] None   Follow up Visit:   Please schedule this patient for Postpartum visit in: 6 weeks with the following provider: Any provider For C/S patients schedule nurse incision check in weeks 2  weeks: NA High risk pregnancy complicated by: GDM Delivery mode:  SVD Anticipated Birth Control:  Plans Interval BTL. Needs Pre-op visit. PP Procedures needed: 2 hour GTT  Schedule Integrated BH visit: no      Newborn Data: Live born female  Birth Weight:   APGAR: 67, 9  Newborn Delivery   Birth date/time:  04/06/2019 11:51:00 Delivery type:  Vaginal, Spontaneous     Baby Feeding: Breast Disposition:home with mother   04/08/2019 Hansel Feinstein, CNM

## 2019-04-06 NOTE — Progress Notes (Signed)
When adjusting pt's FHM pt stated that right eye was 'drooping". RN noted that eye was edematous and bloodshot. Spoke with Dr Tobias Alexander, anaesthesiologist and no orders received. Will continue to monitor.

## 2019-04-06 NOTE — Anesthesia Preprocedure Evaluation (Signed)
Anesthesia Evaluation  Patient identified by MRN, date of birth, ID band Patient awake    Reviewed: Allergy & Precautions, H&P , NPO status , Patient's Chart, lab work & pertinent test results, reviewed documented beta blocker date and time   Airway Mallampati: II  TM Distance: >3 FB Neck ROM: full    Dental no notable dental hx.    Pulmonary neg pulmonary ROS,    Pulmonary exam normal breath sounds clear to auscultation       Cardiovascular negative cardio ROS Normal cardiovascular exam Rhythm:regular Rate:Normal     Neuro/Psych negative neurological ROS  negative psych ROS   GI/Hepatic negative GI ROS, Neg liver ROS,   Endo/Other  diabetes, Gestational  Renal/GU negative Renal ROS  negative genitourinary   Musculoskeletal   Abdominal   Peds  Hematology negative hematology ROS (+)   Anesthesia Other Findings   Reproductive/Obstetrics (+) Pregnancy                             Anesthesia Physical Anesthesia Plan  ASA: III  Anesthesia Plan: Epidural   Post-op Pain Management:    Induction:   PONV Risk Score and Plan:   Airway Management Planned:   Additional Equipment:   Intra-op Plan:   Post-operative Plan:   Informed Consent: I have reviewed the patients History and Physical, chart, labs and discussed the procedure including the risks, benefits and alternatives for the proposed anesthesia with the patient or authorized representative who has indicated his/her understanding and acceptance.     Dental Advisory Given  Plan Discussed with:   Anesthesia Plan Comments: (Labs checked- platelets confirmed with RN in room. Fetal heart tracing, per RN, reported to be stable enough for sitting procedure. Discussed epidural, and patient consents to the procedure:  included risk of possible headache,backache, failed block, allergic reaction, and nerve injury. This patient was asked  if she had any questions or concerns before the procedure started.)        Anesthesia Quick Evaluation

## 2019-04-06 NOTE — MAU Note (Signed)
Pt reports contractions that started Sunday, but tonight they got much worse. States they are every 5 mins x1 hour. Denies LOF, but reports some vaginal bleeding. States that earlier today it was mucousy, but now it is bright red. Reports good fetal movement. States she has not had a cervical exam recently.

## 2019-04-06 NOTE — Anesthesia Procedure Notes (Signed)
Epidural Patient location during procedure: OB Start time: 04/06/2019 4:18 AM End time: 04/06/2019 4:22 AM  Staffing Anesthesiologist: Janeece Riggers, MD  Preanesthetic Checklist Completed: patient identified, site marked, surgical consent, pre-op evaluation, timeout performed, IV checked, risks and benefits discussed and monitors and equipment checked  Epidural Patient position: sitting Prep: site prepped and draped and DuraPrep Patient monitoring: continuous pulse ox and blood pressure Approach: midline Location: L3-L4 Injection technique: LOR air  Needle:  Needle type: Tuohy  Needle gauge: 17 G Needle length: 9 cm and 9 Needle insertion depth: 6 cm Catheter type: closed end flexible Catheter size: 19 Gauge Catheter at skin depth: 11 cm Test dose: negative  Assessment Events: blood not aspirated, injection not painful, no injection resistance, negative IV test and no paresthesia

## 2019-04-06 NOTE — Anesthesia Postprocedure Evaluation (Signed)
Anesthesia Post Note  Patient: Nichole Edwards  Procedure(s) Performed: AN AD HOC LABOR EPIDURAL     Patient location during evaluation: Mother Baby Anesthesia Type: Epidural Level of consciousness: awake and alert Pain management: pain level controlled Vital Signs Assessment: post-procedure vital signs reviewed and stable Respiratory status: spontaneous breathing, nonlabored ventilation and respiratory function stable Cardiovascular status: stable Postop Assessment: no headache, no backache and epidural receding Anesthetic complications: no    Last Vitals:  Vitals:   04/06/19 1330 04/06/19 1351  BP: 111/67 115/69  Pulse: 75 72  Resp: 18 16  Temp:  36.9 C  SpO2:      Last Pain:  Vitals:   04/06/19 1351  TempSrc: Oral  PainSc: 2    Pain Goal: Patients Stated Pain Goal: 4 (04/06/19 1351)                 [name]

## 2019-04-06 NOTE — H&P (Signed)
OBSTETRIC ADMISSION HISTORY AND PHYSICAL  Nichole Edwards is a 33 y.o. female G2P1001 with IUP at 77w0dby L/19 presenting for SOL.   Reports fetal movement. Denies vaginal bleeding, leakage of fluids  She received her prenatal care at [facility].  Support person in labor: Father of Baby/Partner  Ultrasounds . 19w2: EFW 50%, anterior placenta, no fetal anomalies . 32w3: EFW 79% . 36w3: EFW 57%  Prenatal History/Complications: . A1GDM . Maternal Obesity . Late PNC at 19 weeks  Past Medical History: Past Medical History:  Diagnosis Date  . Gestational diabetes   . Medical history non-contributory     Past Surgical History: Past Surgical History:  Procedure Laterality Date  . CHOLECYSTECTOMY      Obstetrical History: OB History    Gravida  2   Para  1   Term  1   Preterm      AB      Living  1     SAB      TAB      Ectopic      Multiple      Live Births              Social History: Social History   Socioeconomic History  . Marital status: Single    Spouse name: Not on file  . Number of children: Not on file  . Years of education: Not on file  . Highest education level: Not on file  Occupational History  . Not on file  Social Needs  . Financial resource strain: Not hard at all  . Food insecurity:    Worry: Never true    Inability: Never true  . Transportation needs:    Medical: No    Non-medical: Not on file  Tobacco Use  . Smoking status: Never Smoker  . Smokeless tobacco: Never Used  Substance and Sexual Activity  . Alcohol use: Not Currently  . Drug use: Never  . Sexual activity: Not Currently    Birth control/protection: None  Lifestyle  . Physical activity:    Days per week: Not on file    Minutes per session: Not on file  . Stress: Not at all  Relationships  . Social connections:    Talks on phone: Not on file    Gets together: Not on file    Attends religious service: Not on file    Active member of club or  organization: Not on file    Attends meetings of clubs or organizations: Not on file    Relationship status: Not on file  Other Topics Concern  . Not on file  Social History Narrative  . Not on file    Family History: History reviewed. No pertinent family history.  Allergies: No Known Allergies  Medications Prior to Admission  Medication Sig Dispense Refill Last Dose  . Prenatal Multivit-Min-Fe-FA (PRENATAL VITAMINS PO) Take by mouth.   04/05/2019 at Unknown time  . Accu-Chek FastClix Lancets MISC 1 Device by Percutaneous route 4 (four) times daily. 100 each 12 Taking  . Blood Glucose Monitoring Suppl (ACCU-CHEK GUIDE) w/Device KIT 1 Device by Does not apply route 4 (four) times daily. 1 kit 0 Taking  . Blood Pressure Monitoring (BLOOD PRESSURE MONITOR/S CUFF) MISC PLACE CUFF ON ARM TO CHECK BP DAILY 1 each 0 Taking  . glucose blood (ACCU-CHEK GUIDE) test strip Use to check blood sugars four times a day was instructed 50 each 12 Taking     Review of Systems  All  systems reviewed and negative except as stated in HPI  Blood pressure 128/81, pulse 84, temperature 98.1 F (36.7 C), temperature source Oral, resp. rate 18, height '5\' 3"'  (1.6 m), weight 111.7 kg, last menstrual period 07/07/2018, SpO2 99 %. General appearance: alert, mildly uncomfortable appearing Lungs: no respiratory distress Heart: regular rate  Abdomen: gravid  Pelvic: deferred Presentation: cephalic by sutures on RN check  Fetal monitoring: 140s/mod/+a/-d Uterine activity: every 2-4 minutes Dilation: 6 Effacement (%): 90 Station: -3 Exam by:: Maryagnes Amos RN/Melanie [employer] CNM  Prenatal labs: ABO, Rh: O/Positive/-- (01/22 1426) Antibody: Negative (01/22 1426) Rubella: 1.34 (01/22 1426) RPR: Non Reactive (03/11 0843)  HBsAg: Negative (01/22 1426)  HIV: Non Reactive (03/11 0843)  GBS: Negative (05/20 0212)  Glucola: abnormal Genetic screening:  Low risk NIPS  negative AFP screen  Prenatal Transfer  Tool  Maternal Diabetes: Yes:  Diabetes Type:  Diet controlled Genetic Screening: Normal Maternal Ultrasounds/Referrals: Normal Fetal Ultrasounds or other Referrals:  None Maternal Substance Abuse:  No Significant Maternal Medications:  None Significant Maternal Lab Results: None  No results found for this or any previous visit (from the past 24 hour(s)).  Patient Active Problem List   Diagnosis Date Noted  . Gestational diabetes mellitus 01/07/2019  . UTI in pregnancy, antepartum 11/23/2018  . Encounter for supervision of normal pregnancy, antepartum 11/18/2018  . Late prenatal care 11/18/2018  . Morbid obesity ([place]) 11/18/2018  . Obesity in pregnancy 11/18/2018    Assessment/Plan:  Nichole Edwards is a 33 y.o. G2P1001 at 34w0dhere for SOL.  Labor: SOL. Expectant management, anticipate SVD. -- pain control: epidural  Fetal Wellbeing: EFW 57% at 36w3, proven to 3900g. Cephalic by sutures on RN check.  -- GBS (negative) -- continuous fetal monitoring - category I   Postpartum Planning -- breast/BTL -- RI/'[x]' Tdap   Laurel S. WJuleen China DO OB/GYN Fellow

## 2019-04-06 NOTE — Lactation Note (Signed)
This note was copied from a baby's chart. Lactation Consultation Note  Patient Name: Nichole Edwards [id number] Date: 04/06/2019 Reason for consult: Initial assessment;Term;Maternal endocrine disorder Type of Endocrine Disorder?: Diabetes(GDM)  4 hours old FT female who is being exclusively BF by his mother, she's a P2 but this is her first time BF, she didn't BF her other child, he's 32 years old. Mom is already familiar with hand expression, and has been able to spot colostrum already. However when [facility] assisted with hand expression, no colostrum was observed at this point. Mom's nipples are everted and her tissue is very compressible. She had GDM during this pregnancy and reported (+) breast changes. She has a [place] DEBP at home.   Offered assistance with latch but mom politely declined. Then, when she slightly moved baby from her chest to work on hand expression, baby started crying, LC took baby to the left breast in cross cradle hold but he wouldn't latch, he went from crying to be completely asleep. An attempt was documented in [place]. Reviewed normal newborn behavior, agents for nipple/breast care (Lanolin Vs coconut oil) feeding cues and cluster feeding. LC also recommended mom that if she wanted to start pumping early on while at the hospital, to let her RN know when she was ready; in case supplementation becomes medically necessary.   Feeding plan:  1. Encouraged mom to feed baby STS 8-12 times/24 hours or sooner if feeding cues are present 2. Hand expression and spoon feeding were strongly encouraged  BF brochure, BF resources and feeding diary were reviewed. Mom reported all questions and concerns were answered, she's aware of [facility] services and will call PRN.  Maternal Data Formula Feeding for Exclusion: No Has patient been taught Hand Expression?: Yes Does the patient have breastfeeding experience prior to this delivery?: No(She didn't BF her first child)  Feeding Feeding Type:  Breast Fed  LATCH Score Latch: Repeated attempts needed to sustain latch, nipple held in mouth throughout feeding, stimulation needed to elicit sucking reflex.  Audible Swallowing: A few with stimulation  Type of Nipple: Everted at rest and after stimulation  Comfort (Breast/Nipple): Soft / non-tender  Hold (Positioning): Assistance needed to correctly position infant at breast and maintain latch.  LATCH Score: 7  Interventions Interventions: Breast feeding basics reviewed;Assisted with latch;Skin to skin;Breast massage;Hand express;Breast compression;Adjust position;Support pillows  Lactation Tools Discussed/Used WIC Program: No(but plans to signing in after leaving the hospital)   Consult Status Consult Status: Follow-up Date: 04/07/19 Follow-up type: In-patient    Nichole Edwards Nichole Edwards 04/06/2019, 4:44 PM

## 2019-04-07 ENCOUNTER — Encounter: Payer: Medicaid Other | Admitting: Certified Nurse Midwife

## 2019-04-07 LAB — ABO/RH: ABO/RH(D): O POS

## 2019-04-07 MED ORDER — SENNOSIDES-DOCUSATE SODIUM 8.6-50 MG PO TABS
2.0000 | ORAL_TABLET | Freq: Every evening | ORAL | Status: DC | PRN
  Filled 2019-04-07: qty 2

## 2019-04-07 NOTE — Lactation Note (Signed)
This note was copied from a baby's chart. Lactation Consultation Note  Patient Name: Nichole Edwards Date: 04/07/2019 Reason for consult: Follow-up assessment;1st time breastfeeding;Term  P2 mother whose infant is now 59 hours old.  Mother did not breast feed her first child (now 32 years old).  Baby was asleep in mother's arms while lab was doing a heelstick when I arrived.  Baby did not awaken during the lab draw at all.  Mother had no immediate questions/concerns related to breast feeding.  She feels like he has been latching well and denies pain with latching.  She feels a tug at her breast and he actively feeds when he is ready.  Encouraged to continue feeding 8-12 times/24 hours or sooner if he shows cues.  Mother is familiar with feeding cues and hand expression.  Suggested she do hand expression before/after feedings to help increase milk supply.  Colostrum container provided and milk storage times reviewed.  Finger feeding demonstrated.  Discussed cluster feeding.  Mother has a DEBP for home use and will stay at home for a few months before returning to work.  Encouraged her to call her RN/LC for latch assistance as needed.  Father present.   Maternal Data Formula Feeding for Exclusion: No Has patient been taught Hand Expression?: Yes Does the patient have breastfeeding experience prior to this delivery?: No  Feeding Feeding Type: Breast Fed  LATCH Score                   Interventions    Lactation Tools Discussed/Used WIC Program: No(Plans to apply)   Consult Status Consult Status: Follow-up Date: 04/08/19 Follow-up type: In-patient    Nichole Edwards R Nichole Edwards 04/07/2019, 12:40 PM

## 2019-04-07 NOTE — Progress Notes (Signed)
Post Partum Day 1  Subjective: Up ad lib, voiding, tolerating PO, and + flatus. Denies dizziness, lightheadedness, tachycardia. Appropriate Lochia. Pain well controlled.  Objective: Blood pressure 120/81, pulse 83, temperature 98 F (36.7 C), resp. rate 18, height 5\' 3"  (1.6 m), weight 111.7 kg, last menstrual period 07/07/2018, SpO2 100 %, unknown if currently breastfeeding. Intake/Output      06/09 0701 - 06/10 0700   I.V. (mL/kg) 0 (0)   Other 0   Total Intake(mL/kg) 0 (0)   Urine (mL/kg/hr) 575 (0.2)   Blood 50   Total Output 625   Net -625       Urine Occurrence 1 x     Physical Exam:  BP 120/81 (BP Location: Left Arm)   Pulse 83   Temp 98 F (36.7 C)   Resp 18   Ht 5\' 3"  (1.6 m)   Wt 111.7 kg   LMP 07/07/2018 (Within Days)   SpO2 100%   Breastfeeding Unknown   BMI 43.63 kg/m  General: alert, cooperative, appears stated age, and no distress Lungs: no increased WOB Lochia: appropriate Uterine Fundus: firm Incision: healing well, no significant drainage, no dehiscence, no significant erythema DVT Evaluation: No evidence of DVT seen on physical exam, extremities warm and well perfused.   Recent Labs    04/06/19 0323  HGB 11.3*  HCT 33.8*   CBG: Recent Labs  Lab 04/06/19 0345 04/06/19 0641 04/06/19 0857 04/06/19 1109  GLUCAP 104* 95 71 62*    Assessment/Plan: Postpartum Day # 1 . Plan for d/c tomorrow  . Continue daily lactation  . Contraception: discussed and patient desires to BTL postpartum. Interval POPs which she will start outpatient.  . Circumcision: yes  . Discussed need for postpartum assistance  Gestational Diabetes  CBGs wnl.   Monitor CBGs  Outpatient follow up    LOS: 1 day   Wilber Oliphant 04/07/2019, 6:58 AM

## 2019-04-08 MED ORDER — IBUPROFEN 600 MG PO TABS: 600.0000 mg | ORAL_TABLET | Freq: Four times a day (QID) | ORAL | 0 refills | Status: DC

## 2019-04-08 NOTE — Lactation Note (Signed)
This note was copied from a baby's chart. Lactation Consultation Note  Patient Name: Nichole Edwards [id number] Date: 04/08/2019 Reason for consult: Follow-up assessment;Other (Comment);Infant weight loss;Term(7% weight loss Bili at 42 hours 10.5 see LC note) Type of Endocrine Disorder?: Diabetes  Baby is 82 hours old - 7 % weight loss - reason for LC F/U is due to weight loss  LC reviewed doc flow sheets / only one Latch score of 7 on the doc flow sheets.  LC explained to mom latching scoring and she mentioned baby is latching and feeding well.  Per mom has a pump at home.  LC stressed the importance of STS feedings until the baby is back to birth weight gaining steadily, back to birth weight,  And can stay awake for majority of feedings. Mom denies soreness , sore nipple and engorgement prevention and tx reviewed.  Discussed nutritive vs non - nutritive feeding patterns and the importance of watching the baby  For hanging out latched.  Mom aware of the [place] lactation resources after D/C and the hand outs.       Maternal Data    Feeding Feeding Type: (per mom baby has recently fed - present;y asleep)  LATCH Score                   Interventions Interventions: Breast feeding basics reviewed  Lactation Tools Discussed/Used Pump Review: Milk Storage   Consult Status Consult Status: Complete Date: 04/08/19    Myer Haff 04/08/2019, 9:28 AM

## 2019-04-09 ENCOUNTER — Other Ambulatory Visit (HOSPITAL_COMMUNITY): Payer: Medicaid Other

## 2019-04-12 ENCOUNTER — Ambulatory Visit (INDEPENDENT_AMBULATORY_CARE_PROVIDER_SITE_OTHER): Payer: Medicaid Other | Admitting: Obstetrics and Gynecology

## 2019-04-12 ENCOUNTER — Encounter: Payer: Self-pay | Admitting: Obstetrics and Gynecology

## 2019-04-12 ENCOUNTER — Other Ambulatory Visit: Payer: Self-pay

## 2019-04-12 VITALS — BP 124/86 | HR 74 | Temp 97.5°F | Wt 235.0 lb

## 2019-04-12 DIAGNOSIS — Z3009 Encounter for other general counseling and advice on contraception: Secondary | ICD-10-CM

## 2019-04-12 DIAGNOSIS — Z01818 Encounter for other preprocedural examination: Secondary | ICD-10-CM

## 2019-04-12 MED ORDER — NORETHINDRONE 0.35 MG PO TABS: 1.0000 | ORAL_TABLET | Freq: Every day | ORAL | 11 refills | Status: DC

## 2019-04-12 NOTE — Progress Notes (Signed)
GYNECOLOGY OFFICE FOLLOW UP NOTE  History:  33 y.o. [id number] here today for follow up for pre-op counseling for interval tubal ligation. She is s/p SVD about 1 week ago and is feeling well.     Past Medical History:  Diagnosis Date  . Gestational diabetes   . Medical history non-contributory     Past Surgical History:  Procedure Laterality Date  . CHOLECYSTECTOMY       Current Outpatient Medications:  .  Accu-Chek FastClix Lancets MISC, 1 Device by Percutaneous route 4 (four) times daily., Disp: 100 each, Rfl: 12 .  Blood Glucose Monitoring Suppl (ACCU-CHEK GUIDE) w/Device KIT, 1 Device by Does not apply route 4 (four) times daily., Disp: 1 kit, Rfl: 0 .  Blood Pressure Monitoring (BLOOD PRESSURE MONITOR/S CUFF) MISC, PLACE CUFF ON ARM TO CHECK BP DAILY, Disp: 1 each, Rfl: 0 .  glucose blood (ACCU-CHEK GUIDE) test strip, Use to check blood sugars four times a day was instructed, Disp: 50 each, Rfl: 12 .  ibuprofen (ADVIL) 600 MG tablet, Take 1 tablet (600 mg total) by mouth every 6 (six) hours., Disp: 30 tablet, Rfl: 0 .  norethindrone (MICRONOR) 0.35 MG tablet, Take 1 tablet (0.35 mg total) by mouth daily., Disp: 1 Package, Rfl: 11 .  Prenatal Multivit-Min-Fe-FA (PRENATAL VITAMINS PO), Take by mouth., Disp: , Rfl:   The following portions of the patient's history were reviewed and updated as appropriate: allergies, current medications, past family history, past medical history, past social history, past surgical history and problem list.   Review of Systems:  Pertinent items noted in HPI and remainder of comprehensive ROS otherwise negative.   Objective:  Physical Exam BP 124/86   Pulse 74   Temp (!) 97.5 F (36.4 C) (Oral)   Wt 235 lb (106.6 kg)   BMI 41.63 kg/m  CONSTITUTIONAL: Well-developed, well-nourished female in no acute distress.  HENT:  Normocephalic, atraumatic. External right and left ear normal. Oropharynx is clear and moist EYES: Conjunctivae and EOM are  normal. Pupils are equal, round, and reactive to light. No scleral icterus.  NECK: Normal range of motion, supple, no masses SKIN: Skin is warm and dry. No rash noted. Not diaphoretic. No erythema. No pallor. NEUROLOGIC: Alert and oriented to person, place, and time. Normal reflexes, muscle tone coordination. No cranial nerve deficit noted. PSYCHIATRIC: Normal mood and affect. Normal behavior. Normal judgment and thought content. CARDIOVASCULAR: Normal heart rate noted RESPIRATORY: Effort normal, no problems with respiration noted ABDOMEN: Soft, no distention noted.   PELVIC: deferred MUSCULOSKELETAL: Normal range of motion. No edema noted.  Labs and Imaging   Assessment & Plan:   1. Pre-op evaluation  2. Encounter for counseling regarding contraception - Patient here for interval tubal planning. She again affirms desire to have bilateral tubal ligation done. She understands that this is a permanent procedure and she will not be able to have children after it is done. Reviewed risks of bilateral tubal ligation including infection, hemorrhage, damage to surrounding tissue and organs, risk of regret. Reviewed that bilateral tubal ligation is not 100% effective and she should take a pregnancy test if she believes for any reason she may be pregnant. Reviewed slightly increased risk of ectopic pregnancy and need to seek care if she becomes pregnant. She understands this is an elective procedure and again affirms her desire. Also consents to blood transfusion if necessary.  - Reviewed pre-op instructions, expected post-op course. She understands she will need to be NPO after midnight the  night prior to the procedure. She understands she will have a PAT visit. She understands she will be called by the administrative scheduler to schedule date/time for the above. Answered all questions, she will call with any issues. - will schedule BTL for >6 weeks out - papers signed today - also briefly reviewed  LARCs if she decides against tubal, will call if she changes her mind  Routine preventative health maintenance measures emphasized. Please refer to After Visit Summary for other counseling recommendations.   Return for post partum check.  Total face-to-face time with patient: 20 minutes. Over 50% of encounter was spent on counseling and coordination of care.  Feliz Beam, M.D. Attending Center for [employer])

## 2019-04-12 NOTE — Progress Notes (Signed)
Patient presents for Consult for BTL.  PP appt is scheduled for 05/04/19

## 2019-04-13 ENCOUNTER — Inpatient Hospital Stay (HOSPITAL_COMMUNITY): Payer: Medicaid Other

## 2019-05-04 ENCOUNTER — Other Ambulatory Visit: Payer: Self-pay

## 2019-05-04 ENCOUNTER — Ambulatory Visit (INDEPENDENT_AMBULATORY_CARE_PROVIDER_SITE_OTHER): Payer: Medicaid Other | Admitting: Obstetrics & Gynecology

## 2019-05-04 ENCOUNTER — Encounter: Payer: Self-pay | Admitting: Obstetrics & Gynecology

## 2019-05-04 DIAGNOSIS — Z30014 Encounter for initial prescription of intrauterine contraceptive device: Secondary | ICD-10-CM

## 2019-05-04 DIAGNOSIS — Z30430 Encounter for insertion of intrauterine contraceptive device: Secondary | ICD-10-CM | POA: Diagnosis not present

## 2019-05-04 DIAGNOSIS — Z8632 Personal history of gestational diabetes: Secondary | ICD-10-CM

## 2019-05-04 DIAGNOSIS — Z1389 Encounter for screening for other disorder: Secondary | ICD-10-CM

## 2019-05-04 DIAGNOSIS — Z3202 Encounter for pregnancy test, result negative: Secondary | ICD-10-CM

## 2019-05-04 LAB — POCT URINE PREGNANCY: Preg Test, Ur: NEGATIVE

## 2019-05-04 MED ORDER — LEVONORGESTREL 19.5 MCG/DAY IU IUD
INTRAUTERINE_SYSTEM | Freq: Once | INTRAUTERINE | Status: AC
  Administered 2019-05-04: 10:00:00 via INTRAUTERINE

## 2019-05-04 NOTE — Patient Instructions (Signed)
Levonorgestrel intrauterine device (IUD) What is this medicine? LEVONORGESTREL IUD (LEE voe nor jes trel) is a contraceptive (birth control) device. The device is placed inside the uterus by a healthcare professional. It is used to prevent pregnancy. This device can also be used to treat heavy bleeding that occurs during your period. This medicine may be used for other purposes; ask your health care provider or pharmacist if you have questions. COMMON BRAND NAME(S): Kyleena, LILETTA, Mirena, Skyla What should I tell my health care provider before I take this medicine? They need to know if you have any of these conditions:  abnormal Pap smear  cancer of the breast, uterus, or cervix  diabetes  endometritis  genital or pelvic infection now or in the past  have more than one sexual partner or your partner has more than one partner  heart disease  history of an ectopic or tubal pregnancy  immune system problems  IUD in place  liver disease or tumor  problems with blood clots or take blood-thinners  seizures  use intravenous drugs  uterus of unusual shape  vaginal bleeding that has not been explained  an unusual or allergic reaction to levonorgestrel, other hormones, silicone, or polyethylene, medicines, foods, dyes, or preservatives  pregnant or trying to get pregnant  breast-feeding How should I use this medicine? This device is placed inside the uterus by a health care professional. Talk to your pediatrician regarding the use of this medicine in children. Special care may be needed. Overdosage: If you think you have taken too much of this medicine contact a poison control center or emergency room at once. NOTE: This medicine is only for you. Do not share this medicine with others. What if I miss a dose? This does not apply. Depending on the brand of device you have inserted, the device will need to be replaced every 3 to 6 years if you wish to continue using this type  of birth control. What may interact with this medicine? Do not take this medicine with any of the following medications:  amprenavir  bosentan  fosamprenavir This medicine may also interact with the following medications:  aprepitant  armodafinil  barbiturate medicines for inducing sleep or treating seizures  bexarotene  boceprevir  griseofulvin  medicines to treat seizures like carbamazepine, ethotoin, felbamate, oxcarbazepine, phenytoin, topiramate  modafinil  pioglitazone  rifabutin  rifampin  rifapentine  some medicines to treat HIV infection like atazanavir, efavirenz, indinavir, lopinavir, nelfinavir, tipranavir, ritonavir  St. John's wort  warfarin This list may not describe all possible interactions. Give your health care provider a list of all the medicines, herbs, non-prescription drugs, or dietary supplements you use. Also tell them if you smoke, drink alcohol, or use illegal drugs. Some items may interact with your medicine. What should I watch for while using this medicine? Visit your doctor or health care professional for regular check ups. See your doctor if you or your partner has sexual contact with others, becomes HIV positive, or gets a sexual transmitted disease. This product does not protect you against HIV infection (AIDS) or other sexually transmitted diseases. You can check the placement of the IUD yourself by reaching up to the top of your vagina with clean fingers to feel the threads. Do not pull on the threads. It is a good habit to check placement after each menstrual period. Call your doctor right away if you feel more of the IUD than just the threads or if you cannot feel the threads at   all. The IUD may come out by itself. You may become pregnant if the device comes out. If you notice that the IUD has come out use a backup birth control method like condoms and call your health care provider. Using tampons will not change the position of the  IUD and are okay to use during your period. This IUD can be safely scanned with magnetic resonance imaging (MRI) only under specific conditions. Before you have an MRI, tell your healthcare provider that you have an IUD in place, and which type of IUD you have in place. What side effects may I notice from receiving this medicine? Side effects that you should report to your doctor or health care professional as soon as possible:  allergic reactions like skin rash, itching or hives, swelling of the face, lips, or tongue  fever, flu-like symptoms  genital sores  high blood pressure  no menstrual period for 6 weeks during use  pain, swelling, warmth in the leg  pelvic pain or tenderness  severe or sudden headache  signs of pregnancy  stomach cramping  sudden shortness of breath  trouble with balance, talking, or walking  unusual vaginal bleeding, discharge  yellowing of the eyes or skin Side effects that usually do not require medical attention (report to your doctor or health care professional if they continue or are bothersome):  acne  breast pain  change in sex drive or performance  changes in weight  cramping, dizziness, or faintness while the device is being inserted  headache  irregular menstrual bleeding within first 3 to 6 months of use  nausea This list may not describe all possible side effects. Call your doctor for medical advice about side effects. You may report side effects to FDA at 1-800-FDA-1088. Where should I keep my medicine? This does not apply. NOTE: This sheet is a summary. It may not cover all possible information. If you have questions about this medicine, talk to your doctor, pharmacist, or health care provider.  2020 Elsevier/Gold Standard (2018-08-25 13:22:01)  

## 2019-05-04 NOTE — Progress Notes (Signed)
Patient ID: Nichole Edwards, female   DOB: 1985-12-14, 34 y.o.   MRN: [id number]    GYNECOLOGY OFFICE PROCEDURE NOTE  Nichole Edwards is a 33 y.o. [id number] here for [place] IUD insertion. No GYN concerns.  Last pap smear was on 10/2018 and was normal.  IUD Insertion Procedure Note Patient identified, informed consent performed, consent signed.   Discussed risks of irregular bleeding, cramping, infection, malpositioning or misplacement of the IUD outside the uterus which may require further procedure such as laparoscopy. Time out was performed.  Urine pregnancy test negative.  Speculum placed in the vagina.  Cervix visualized.  Cleaned with Betadine x 2.  Grasped anteriorly with a single tooth tenaculum.  Uterus sounded to 9 cm.  Liletta IUD placed per manufacturer's recommendations.  Strings trimmed to 3 cm. Tenaculum was removed, good hemostasis noted.  Patient tolerated procedure well.   [name].  She was advised to have backup contraception for one week.  Patient was also asked to check IUD strings periodically and follow up in 4 weeks for IUD check.   Emeterio Reeve, MD, [place] Attending [place], [facility] for [facility], [place]

## 2019-05-04 NOTE — Progress Notes (Signed)
Subjective:     Nichole Edwards is a 33 y.o. female who presents for a postpartum visit. She is 4 weeks postpartum following a spontaneous vaginal delivery. I have fully reviewed the prenatal and intrapartum course. The delivery was at 39.0 gestational weeks. Outcome: spontaneous vaginal delivery. Anesthesia: epidural. Postpartum course has been Unremarkable. Baby's course has been Unremarkable. Baby is feeding by both breast and bottle - Nichole Edwards Start. Bleeding staining only. Bowel function is normal. Bladder function is normal. Patient is not sexually active. Contraception method is none. Postpartum depression screening: negative.  The following portions of the patient's history were reviewed and updated as appropriate: allergies, current medications, past family history, past medical history, past social history, past surgical history and problem list.  Review of Systems Pertinent items are noted in HPI.   Objective:    BP 114/78   Pulse 80   Ht 5\' 3"  (1.6 m)   Wt 230 lb (104.3 kg)   Breastfeeding Yes Comment: both  BMI 40.74 kg/m   General:  alert, cooperative and no distress           Abdomen: soft, non-tender; bowel sounds normal; no masses,  no organomegaly   Vulva:  normal  Vagina: normal vagina  Cervix:  no lesions  Corpus: normal  Adnexa:  not evaluated  Rectal Exam: Not performed.        Assessment:     normal  postpartum exam. Pap smear not done at today's visit.   Plan:    1. Contraception: IUD 2. Liletta insertion today 3. Follow up in: 4 weeks or as needed. Needs string check and 2 hr GTT  Woodroe Mode, MD 05/04/2019

## 2019-05-18 ENCOUNTER — Other Ambulatory Visit: Payer: Medicaid Other

## 2019-05-21 ENCOUNTER — Other Ambulatory Visit: Payer: Medicaid Other

## 2019-05-21 ENCOUNTER — Other Ambulatory Visit: Payer: Self-pay

## 2019-05-22 LAB — GLUCOSE TOLERANCE, 2 HOURS
Glucose, 2 hour: 82 mg/dL (ref 65–139)
Glucose, GTT - Fasting: 97 mg/dL (ref 65–99)

## 2019-06-01 ENCOUNTER — Other Ambulatory Visit: Payer: Self-pay

## 2019-06-01 ENCOUNTER — Ambulatory Visit (INDEPENDENT_AMBULATORY_CARE_PROVIDER_SITE_OTHER): Payer: Medicaid Other | Admitting: Obstetrics and Gynecology

## 2019-06-01 ENCOUNTER — Encounter: Payer: Self-pay | Admitting: Obstetrics and Gynecology

## 2019-06-01 DIAGNOSIS — Z30431 Encounter for routine checking of intrauterine contraceptive device: Secondary | ICD-10-CM | POA: Insufficient documentation

## 2019-06-01 NOTE — Progress Notes (Signed)
Nichole Edwards here for IUD check. Doing well No problems Has had IC since insertion without problems  PE AF VSS Lungs clear  Heart RRR Abd soft + BS GU IUD string noted  A/P IUD check up Doing well F/U PRN

## 2019-06-01 NOTE — Progress Notes (Signed)
Pt presents for IUD check. Pt denies having any issues with IUD. She states that her partner has no complaints, and she is able to feel her strings.

## 2019-06-01 NOTE — Patient Instructions (Signed)
Health Maintenance, Female Adopting a healthy lifestyle and getting preventive care are important in promoting health and wellness. Ask your health care provider about:  The right schedule for you to have regular tests and exams.  Things you can do on your own to prevent diseases and keep yourself healthy. What should I know about diet, weight, and exercise? Eat a healthy diet   Eat a diet that includes plenty of vegetables, fruits, low-fat dairy products, and lean protein.  Do not eat a lot of foods that are high in solid fats, added sugars, or sodium. Maintain a healthy weight Body mass index (BMI) is used to identify weight problems. It estimates body fat based on height and weight. Your health care provider can help determine your BMI and help you achieve or maintain a healthy weight. Get regular exercise Get regular exercise. This is one of the most important things you can do for your health. Most adults should:  Exercise for at least 150 minutes each week. The exercise should increase your heart rate and make you sweat (moderate-intensity exercise).  Do strengthening exercises at least twice a week. This is in addition to the moderate-intensity exercise.  Spend less time sitting. Even light physical activity can be beneficial. Watch cholesterol and blood lipids Have your blood tested for lipids and cholesterol at 32 years of age, then have this test every 5 years. Have your cholesterol levels checked more often if:  Your lipid or cholesterol levels are high.  You are older than 32 years of age.  You are at high risk for heart disease. What should I know about cancer screening? Depending on your health history and family history, you may need to have cancer screening at various ages. This may include screening for:  Breast cancer.  Cervical cancer.  Colorectal cancer.  Skin cancer.  Lung cancer. What should I know about heart disease, diabetes, and high blood  pressure? Blood pressure and heart disease  High blood pressure causes heart disease and increases the risk of stroke. This is more likely to develop in people who have high blood pressure readings, are of African descent, or are overweight.  Have your blood pressure checked: ? Every 3-5 years if you are 18-39 years of age. ? Every year if you are 40 years old or older. Diabetes Have regular diabetes screenings. This checks your fasting blood sugar level. Have the screening done:  Once every three years after age 40 if you are at a normal weight and have a low risk for diabetes.  More often and at a younger age if you are overweight or have a high risk for diabetes. What should I know about preventing infection? Hepatitis B If you have a higher risk for hepatitis B, you should be screened for this virus. Talk with your health care provider to find out if you are at risk for hepatitis B infection. Hepatitis C Testing is recommended for:  Everyone born from 1945 through 1965.  Anyone with known risk factors for hepatitis C. Sexually transmitted infections (STIs)  Get screened for STIs, including gonorrhea and chlamydia, if: ? You are sexually active and are younger than 32 years of age. ? You are older than 32 years of age and your health care provider tells you that you are at risk for this type of infection. ? Your sexual activity has changed since you were last screened, and you are at increased risk for chlamydia or gonorrhea. Ask your health care provider if   you are at risk.  Ask your health care provider about whether you are at high risk for HIV. Your health care provider may recommend a prescription medicine to help prevent HIV infection. If you choose to take medicine to prevent HIV, you should first get tested for HIV. You should then be tested every 3 months for as long as you are taking the medicine. Pregnancy  If you are about to stop having your period (premenopausal) and  you may become pregnant, seek counseling before you get pregnant.  Take 400 to 800 micrograms (mcg) of folic acid every day if you become pregnant.  Ask for birth control (contraception) if you want to prevent pregnancy. Osteoporosis and menopause Osteoporosis is a disease in which the bones lose minerals and strength with aging. This can result in bone fractures. If you are 65 years old or older, or if you are at risk for osteoporosis and fractures, ask your health care provider if you should:  Be screened for bone loss.  Take a calcium or vitamin D supplement to lower your risk of fractures.  Be given hormone replacement therapy (HRT) to treat symptoms of menopause. Follow these instructions at home: Lifestyle  Do not use any products that contain nicotine or tobacco, such as cigarettes, e-cigarettes, and chewing tobacco. If you need help quitting, ask your health care provider.  Do not use street drugs.  Do not share needles.  Ask your health care provider for help if you need support or information about quitting drugs. Alcohol use  Do not drink alcohol if: ? Your health care provider tells you not to drink. ? You are pregnant, may be pregnant, or are planning to become pregnant.  If you drink alcohol: ? Limit how much you use to 0-1 drink a day. ? Limit intake if you are breastfeeding.  Be aware of how much alcohol is in your drink. In the [place] one drink equals one 12 oz bottle of beer (355 mL), one 5 oz glass of wine (148 mL), or one 1 oz glass of hard liquor (44 mL). General instructions  Schedule regular health, dental, and eye exams.  Stay current with your vaccines.  Tell your health care provider if: ? You often feel depressed. ? You have ever been abused or do not feel safe at home. Summary  Adopting a healthy lifestyle and getting preventive care are important in promoting health and wellness.  Follow your health care provider's instructions about healthy  diet, exercising, and getting tested or screened for diseases.  Follow your health care provider's instructions on monitoring your cholesterol and blood pressure. This information is not intended to replace advice given to you by your health care provider. Make sure you discuss any questions you have with your health care provider. Document Released: 04/29/2011 Document Revised: 10/07/2018 Document Reviewed: 10/07/2018 Elsevier Patient Education  2020 Elsevier Inc.  

## 2019-06-23 ENCOUNTER — Encounter (HOSPITAL_BASED_OUTPATIENT_CLINIC_OR_DEPARTMENT_OTHER): Payer: Self-pay

## 2019-06-23 ENCOUNTER — Ambulatory Visit (HOSPITAL_BASED_OUTPATIENT_CLINIC_OR_DEPARTMENT_OTHER): Admit: 2019-06-23 | Payer: Medicaid Other | Admitting: Obstetrics and Gynecology

## 2019-06-23 SURGERY — LIGATION, FALLOPIAN TUBE, LAPAROSCOPIC
Anesthesia: Choice | Laterality: Bilateral

## 2019-07-27 ENCOUNTER — Other Ambulatory Visit: Payer: Self-pay

## 2019-07-27 DIAGNOSIS — Z20822 Contact with and (suspected) exposure to covid-19: Secondary | ICD-10-CM

## 2019-07-28 LAB — NOVEL CORONAVIRUS, NAA: SARS-CoV-2, NAA: NOT DETECTED

## 2019-08-11 ENCOUNTER — Ambulatory Visit (HOSPITAL_COMMUNITY)
Admission: EM | Admit: 2019-08-11 | Discharge: 2019-08-11 | Disposition: A | Discharge status: Home / Self Care | Payer: BC Managed Care – PPO | Attending: Family Medicine | Admitting: Family Medicine

## 2019-08-11 ENCOUNTER — Encounter (HOSPITAL_COMMUNITY): Payer: Self-pay | Admitting: Emergency Medicine

## 2019-08-11 ENCOUNTER — Other Ambulatory Visit: Payer: Self-pay

## 2019-08-11 DIAGNOSIS — H6691 Otitis media, unspecified, right ear: Secondary | ICD-10-CM

## 2019-08-11 DIAGNOSIS — J029 Acute pharyngitis, unspecified: Secondary | ICD-10-CM | POA: Diagnosis present

## 2019-08-11 DIAGNOSIS — R05 Cough: Secondary | ICD-10-CM | POA: Insufficient documentation

## 2019-08-11 DIAGNOSIS — R059 Cough, unspecified: Secondary | ICD-10-CM

## 2019-08-11 DIAGNOSIS — Z20828 Contact with and (suspected) exposure to other viral communicable diseases: Secondary | ICD-10-CM | POA: Diagnosis not present

## 2019-08-11 MED ORDER — AMOXICILLIN-POT CLAVULANATE 875-125 MG PO TABS: 1.0000 | ORAL_TABLET | Freq: Two times a day (BID) | ORAL | 0 refills | Status: AC

## 2019-08-11 MED ORDER — FLUTICASONE PROPIONATE 50 MCG/ACT NA SUSP: 1.0000 | Freq: Every day | NASAL | 2 refills | Status: AC

## 2019-08-11 NOTE — ED Triage Notes (Signed)
Ear ache and headache for 2 weeks.  Patient reports right ear pain.    Cough started yesterday and now throat is sore.    Denies fever

## 2019-08-11 NOTE — Discharge Instructions (Signed)
Push fluids to ensure adequate hydration and keep secretions thin.  Tylenol and/or ibuprofen as needed for pain or fevers.  Complete course of antibiotics.  Use of OTC medications as needed for cough. This may linger for the next 10-14 days.  Will notify you of any positive findings from your covid testing and if any changes to treatment are needed. Negative testing will be sent through your MyChart.  If symptoms worsen or do not improve in the next week to return to be seen or to follow up with PCP.

## 2019-08-11 NOTE — ED Provider Notes (Signed)
[facility]    CSN: [id number] Arrival date & time: 08/11/19  3893      History   Chief Complaint Chief Complaint  Patient presents with  . Appointment    8:50  . Sore Throat    HPI Nichole Edwards is a 33 y.o. female.   Kiaira Pointer presents with complaints of right ear pain for the past 2 weeks. Headache as well. Tylenol or advil does help, but headache returns. Cough yesterday. Now with sore throat due to cough. No shortness of breath. No fevers. No congestion o r nasal drainage. No change to hearing. Cough only occasionally productive. No body aches or chills. No known ill contacts. No gi symptoms. Working from home, no one at home works outside of home. No other medications for symptoms. No previous ear issues or recurrent problems. Does not smoke. She is not breastfeeding.    ROS per HPI, negative if not otherwise mentioned.      Past Medical History:  Diagnosis Date  . Gestational diabetes   . Medical history non-contributory     Patient Active Problem List   Diagnosis Date Noted  . IUD check up 06/01/2019  . History of gestational diabetes 05/04/2019  . Morbid obesity (HCC) 11/18/2018    Past Surgical History:  Procedure Laterality Date  . CHOLECYSTECTOMY      OB History    Gravida  2   Para  2   Term  2   Preterm      AB      Living  2     SAB      TAB      Ectopic      Multiple  0   Live Births  2            Home Medications    Prior to Admission medications   Medication Sig Start Date End Date Taking? Authorizing Provider  amoxicillin-clavulanate (AUGMENTIN) 875-125 MG tablet Take 1 tablet by mouth every 12 (twelve) hours for 5 days. 08/11/19 08/16/19  Georgetta Haber, NP  fluticasone (FLONASE) 50 MCG/ACT nasal spray Place 1 spray into both nostrils daily. 08/11/19   Georgetta Haber, NP    Family History Family History  Problem Relation Age of Onset  . Parkinson's disease Father     Social History  Social History   Tobacco Use  . Smoking status: Never Smoker  . Smokeless tobacco: Never Used  Substance Use Topics  . Alcohol use: Not Currently  . Drug use: Never     Allergies   Patient has no known allergies.   Review of Systems Review of Systems   Physical Exam Triage Vital Signs ED Triage Vitals  Enc Vitals Group     BP 08/11/19 0903 121/86     Pulse Rate 08/11/19 0903 81     Resp 08/11/19 0903 18     Temp 08/11/19 0903 99.4 F (37.4 C)     Temp Source 08/11/19 0903 Oral     SpO2 08/11/19 0903 99 %     Weight --      Height --      Head Circumference --      Peak Flow --      Pain Score 08/11/19 0859 3     Pain Loc --      Pain Edu? --      Excl. in GC? --    No data found.  Updated Vital Signs BP 121/86 (BP Location: Right  Arm)   Pulse 81   Temp 99.4 F (37.4 C) (Oral)   Resp 18   LMP 07/21/2019   SpO2 99%    Physical Exam Constitutional:      General: She is not in acute distress.    Appearance: She is well-developed.  HENT:     Head:     Comments: Right tm is dull and opaque with appearance of effusion; no redness or swelling however     Right Ear: Ear canal normal.     Left Ear: Tympanic membrane and ear canal normal.  Cardiovascular:     Rate and Rhythm: Normal rate.  Pulmonary:     Effort: Pulmonary effort is normal.  Skin:    General: Skin is warm and dry.  Neurological:     Mental Status: She is alert and oriented to person, place, and time.      UC Treatments / Results  Labs (all labs ordered are listed, but only abnormal results are displayed) Labs Reviewed  NOVEL CORONAVIRUS, NAA (HOSP ORDER, SEND-OUT TO REF LAB; TAT 18-24 HRS)    EKG   Radiology No results found.  Procedures Procedures (including critical care time)  Medications Ordered in UC Medications - No data to display  Initial Impression / Assessment and Plan / UC Course  I have reviewed the triage vital signs and the nursing notes.  Pertinent labs  & imaging results that were available during my care of the patient were reviewed by me and considered in my medical decision making (see chart for details).     Afebrile. No increased work of breathing. No hypoxia. Persistent right ear pain for the past 2 weeks with appearance of effusion, will cover with 5 days of augmentin and encouraged flonase use. Supportive cares for cough with covid testing pending. Return precautions provided. Patient verbalized understanding and agreeable to plan.   Final Clinical Impressions(s) / UC Diagnoses   Final diagnoses:  Right otitis media, unspecified otitis media type  Cough     Discharge Instructions     Push fluids to ensure adequate hydration and keep secretions thin.  Tylenol and/or ibuprofen as needed for pain or fevers.  Complete course of antibiotics.  Use of OTC medications as needed for cough. This may linger for the next 10-14 days.  Will notify you of any positive findings from your covid testing and if any changes to treatment are needed. Negative testing will be sent through your MyChart.  If symptoms worsen or do not improve in the next week to return to be seen or to follow up with PCP.        ED Prescriptions    Medication Sig Dispense Auth. Provider   amoxicillin-clavulanate (AUGMENTIN) 875-125 MG tablet Take 1 tablet by mouth every 12 (twelve) hours for 5 days. 10 tablet Augusto Gamble B, NP   fluticasone (FLONASE) 50 MCG/ACT nasal spray Place 1 spray into both nostrils daily. 16 g Zigmund Gottron, NP     PDMP not reviewed this encounter.   Zigmund Gottron, NP 08/11/19 [phone]

## 2019-08-15 LAB — NOVEL CORONAVIRUS, NAA (HOSP ORDER, SEND-OUT TO REF LAB; TAT 18-24 HRS): SARS-CoV-2, NAA: NOT DETECTED

## 2019-10-29 DIAGNOSIS — U071 COVID-19: Secondary | ICD-10-CM

## 2019-10-29 HISTORY — DX: COVID-19: U07.1

## 2020-01-23 IMAGING — US US MFM OB FOLLOW UP
1 series · 14 of 28 positions shown · non-contrast
Comparison: none

[Series 1: us mfm ob follow up · 14 of 31 slices shown]
[im 2/31]
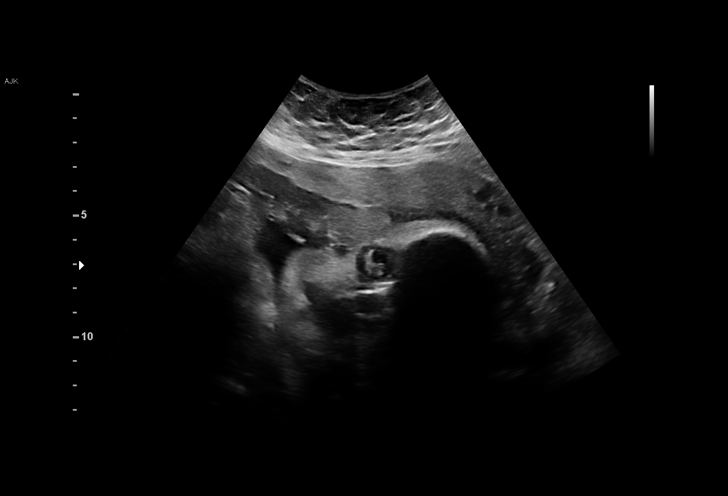
[im 4/31]
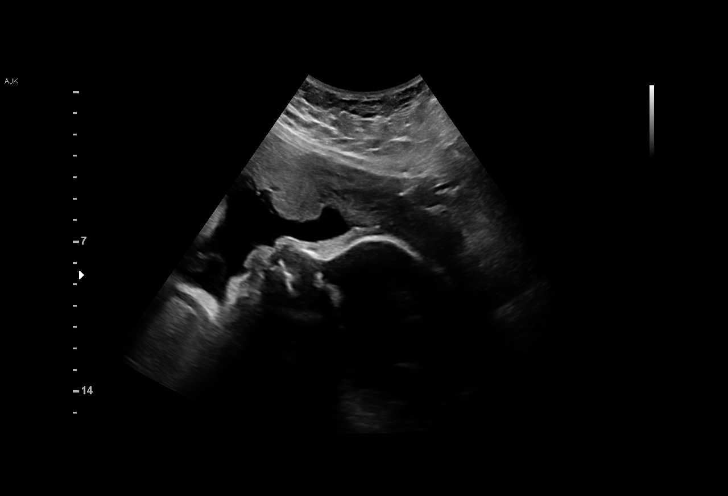
[im 6/31]
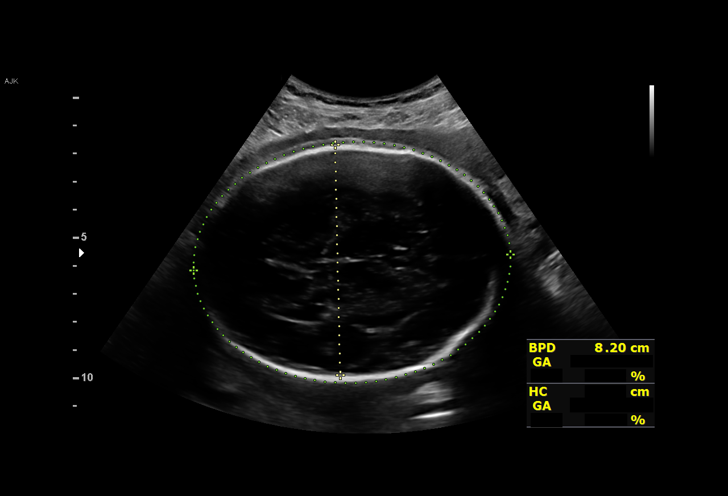
[im 8/31]
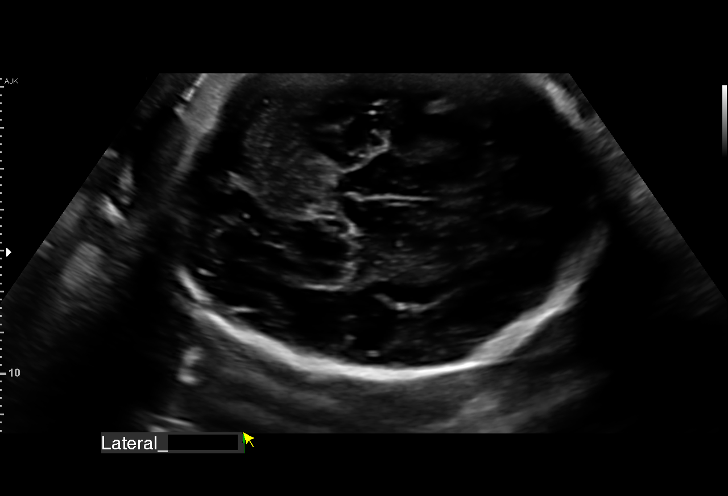
[im 11/31]
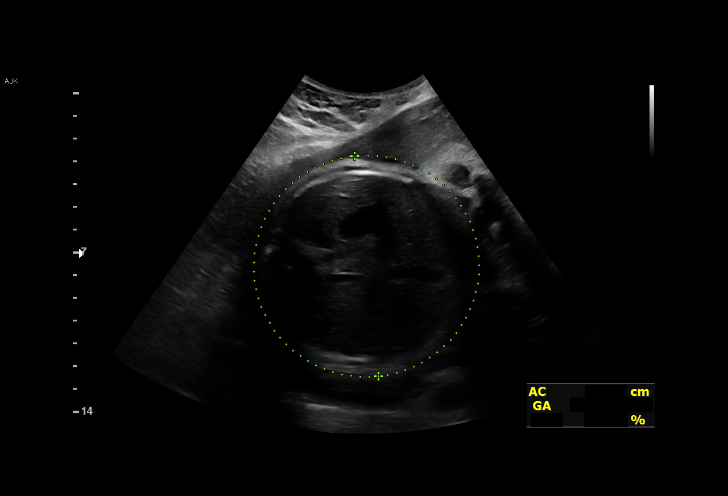
[im 13/31]
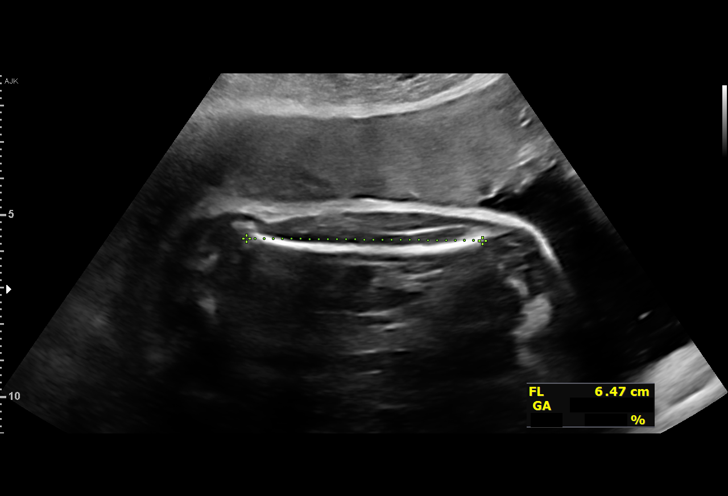
[im 15/31]
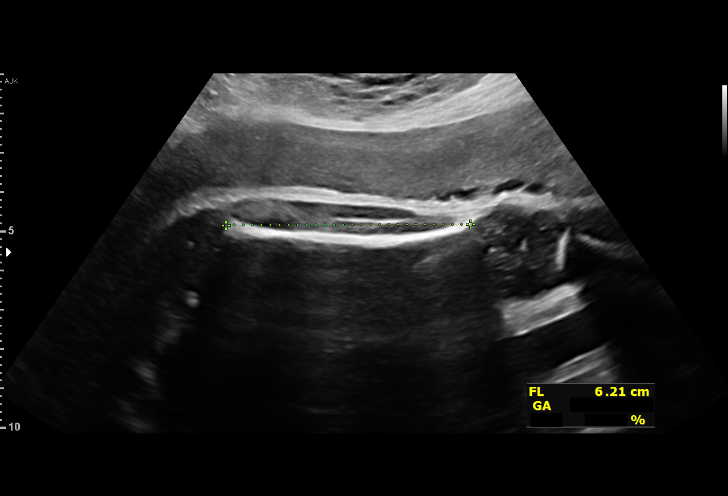
[im 17/31]
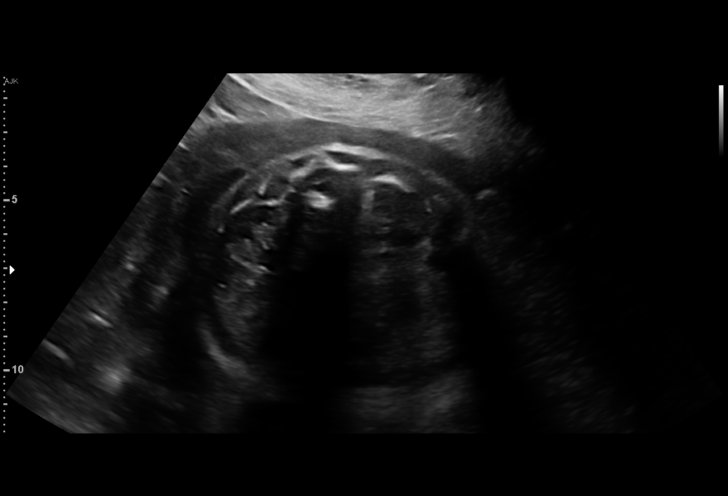
[im 19/31]
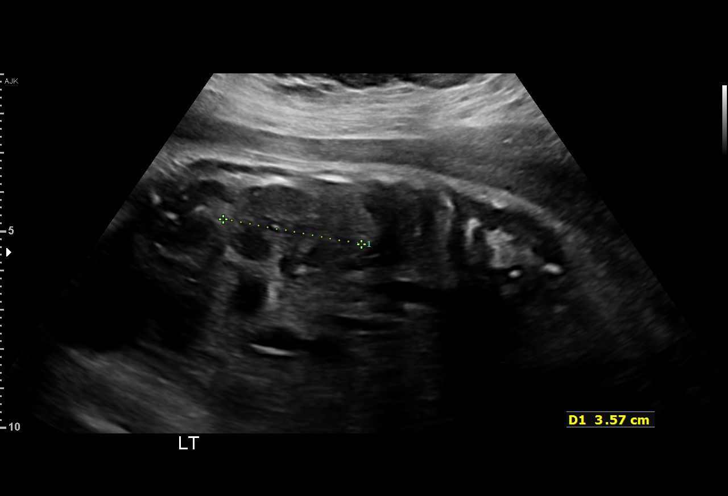
[im 22/31]
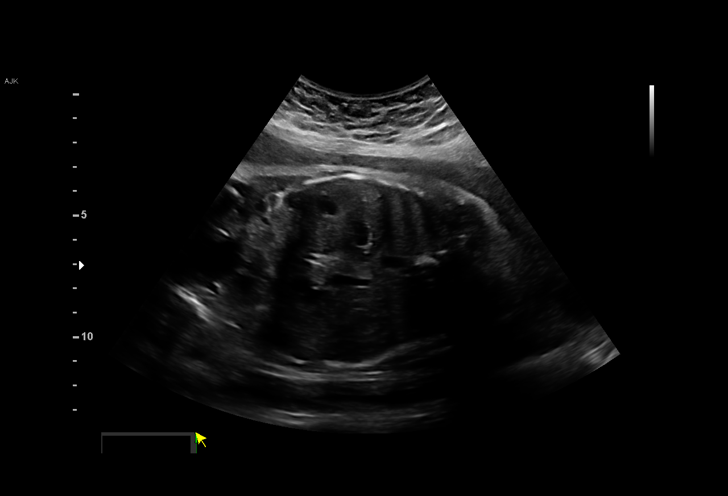
[im 24/31]
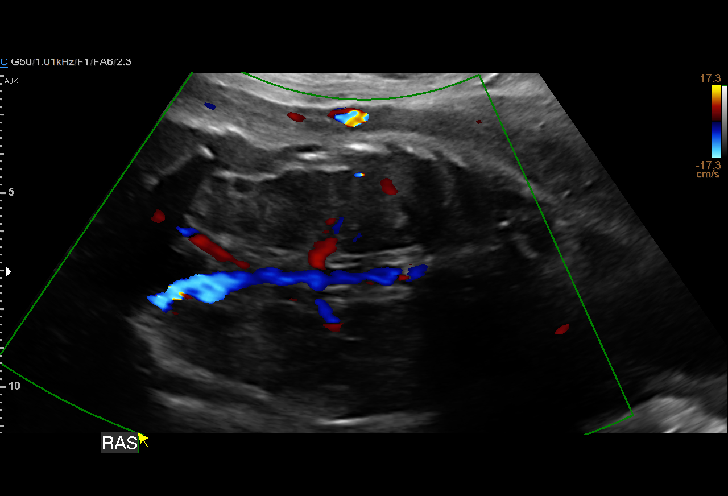
[im 26/31]
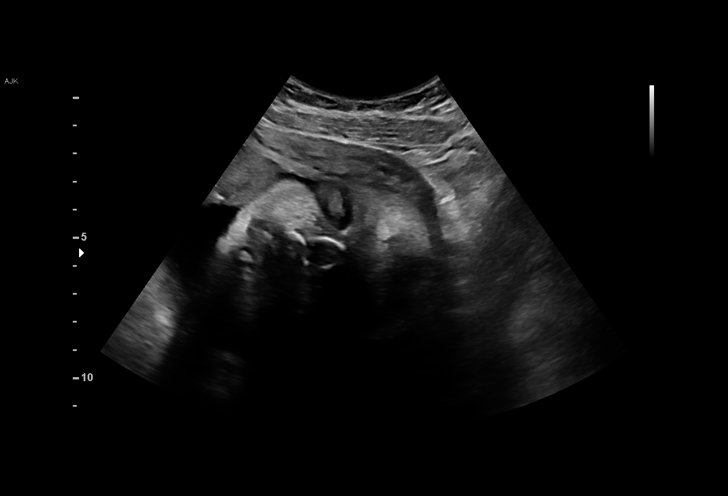
[im 28/31]
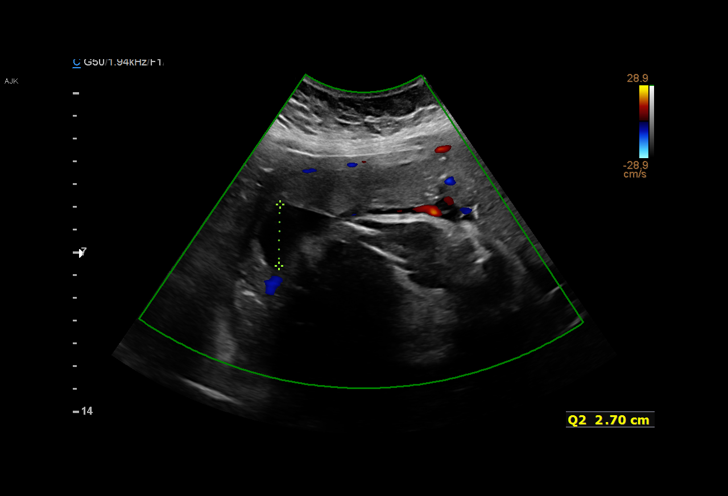
[im 31/31]
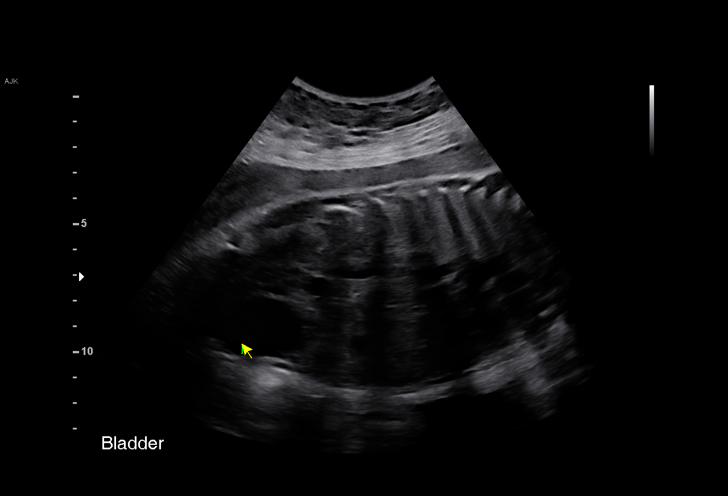

[14 of 28 positions shown; findings below may reference images not displayed]

[address]

                                                       [place]
 ----------------------------------------------------------------------

 ----------------------------------------------------------------------
Indications

  32 weeks gestation of pregnancy
  Obesity complicating pregnancy, third
  trimester
  Gestational diabetes in pregnancy, diet
  controlled
 ----------------------------------------------------------------------
Fetal Evaluation

 Num Of Fetuses:         1
 Fetal Heart Rate(bpm):  149
 Cardiac Activity:       Observed
 Presentation:           Cephalic
 Placenta:               Anterior

 Amniotic Fluid
 AFI FV:      Within normal limits

 AFI Sum(cm)     %Tile       Largest Pocket(cm)
 16.31           59

 RUQ(cm)       RLQ(cm)       LUQ(cm)        LLQ(cm)

Biometry

 BPD:      81.8  mm     G. Age:  32w 6d         56  %    CI:        71.28   %    70 - 86
                                                         FL/HC:      20.1   %    19.1 -
 HC:      308.6  mm     G. Age:  34w 3d         66  %    HC/AC:      1.01        0.96 -
 AC:      306.8  mm     G. Age:  34w 5d         95  %    FL/BPD:     75.9   %    71 - 87
 FL:       62.1  mm     G. Age:  32w 1d         31  %    FL/AC:      20.2   %    20 - 24
 LV:        5.4  mm

 Est. FW:    9980  gm           5 lb     79  %
OB History

 Gravidity:    2         Term:   1        Prem:   0        SAB:   0
 TOP:          0       Ectopic:  0        Living: 1
Gestational Age

 LMP:           32w 3d        Date:  07/07/18                 EDD:   04/13/19
 U/S Today:     33w 4d                                        EDD:   04/05/19
 Best:          32w 3d     Det. By:  LMP  (07/07/18)          EDD:   04/13/19
Anatomy

 Cranium:               Appears normal         Aortic Arch:            Previously seen
 Cavum:                 Appears normal         Ductal Arch:            Previously seen
 Ventricles:            Appears normal         Diaphragm:              Previously seen
 Choroid Plexus:        Previously seen        Stomach:                Appears normal, left
                                                                       sided
 Cerebellum:            Previously seen        Abdomen:                Appears normal
 Posterior Fossa:       Previously seen        Abdominal Wall:         Previously seen
 Nuchal Fold:           Previously seen        Cord Vessels:           Previously seen
 Face:                  Orbits and profile     Kidneys:                Appear normal
                        previously seen
 Lips:                  Appears normal         Bladder:                Appears normal
 Thoracic:              Appears normal         Spine:                  Previously seen
 Heart:                 Previously seen        Upper Extremities:      Previously seen
 RVOT:                  Previously seen        Lower Extremities:      Previously seen
 LVOT:                  Previously seen

 Other:  Heels and 5th digit previously visualized. Fetus appears to be a male.
         Nasal bone visualized.
Impression

 Normal interval growth.
 [id number]
Recommendations

 Follow up growth in 4 weeks.

## 2020-06-13 ENCOUNTER — Other Ambulatory Visit: Payer: Self-pay

## 2020-06-13 ENCOUNTER — Ambulatory Visit (INDEPENDENT_AMBULATORY_CARE_PROVIDER_SITE_OTHER): Payer: Medicaid Other

## 2020-06-13 ENCOUNTER — Encounter (HOSPITAL_COMMUNITY): Payer: Self-pay

## 2020-06-13 ENCOUNTER — Ambulatory Visit (HOSPITAL_COMMUNITY)
Admission: RE | Admit: 2020-06-13 | Discharge: 2020-06-13 | Disposition: A | Discharge status: Home / Self Care | Payer: Medicaid Other | Source: Ambulatory Visit | Attending: Family Medicine | Admitting: Family Medicine

## 2020-06-13 VITALS — BP 124/81 | HR 71 | Temp 98.7°F | Resp 16

## 2020-06-13 DIAGNOSIS — R079 Chest pain, unspecified: Secondary | ICD-10-CM | POA: Diagnosis not present

## 2020-06-13 DIAGNOSIS — R0789 Other chest pain: Secondary | ICD-10-CM

## 2020-06-13 DIAGNOSIS — R21 Rash and other nonspecific skin eruption: Secondary | ICD-10-CM | POA: Diagnosis not present

## 2020-06-13 MED ORDER — NAPROXEN 500 MG PO TABS
500.0000 mg | ORAL_TABLET | Freq: Two times a day (BID) | ORAL | 0 refills | Status: DC
Start: 2020-06-13 — End: 2020-10-18

## 2020-06-13 NOTE — Discharge Instructions (Addendum)
Take naproxen 2 times a day with food  consider learning a meditation/deep breathing technique (YouTube)  consider establishing with PCP for preventative care

## 2020-06-13 NOTE — ED Triage Notes (Signed)
Pt c/o chest pressure and tightness and she feels like she is not getting enough of a deep breath. Pt states she had covid back in January and is still not able to taste or smell. Pt is concerned chest problem may be related to covid. Pt states she gets SOB easily but rest helps.

## 2020-06-13 NOTE — ED Provider Notes (Signed)
[facility]    CSN: [id number] Arrival date & time: 06/13/20  1446      History   Chief Complaint Chief Complaint  Patient presents with  . Appointment 3 pm  . Chest Pain    HPI Nichole Edwards is a 34 y.o. female.   HPI  Patient is here for chest pain. Patient states that she had Covid in January 2021.  She went to visit family out of town and came back, tested positive for Covid.  She states her Covid infection was really quite mild.  She lost her sense of taste and smell.  She had profound fatigue.  She had no cough cold runny nose or shortness of breath during her illness. She recovered from this illness and felt well for months For the last week she has been having a pressure and tightness in her chest.  She feels like she cannot quite take a deep breath.  She states the pressure is in the center of her chest.  She states that a couple of times she has felt a fluttering in her irregular heartbeat.  No nausea or vomiting.  No lightheadedness or dizziness or sweats.  No panic.  No history of lung disease.  No lifting or chest trauma.  No cough cold runny nose sore throat or fever recently.  No history of smoking Patient has concerned that the Covid infection in January is causing her chest pain now.  Past Medical History:  Diagnosis Date  . Gestational diabetes   . Medical history non-contributory     Patient Active Problem List   Diagnosis Date Noted  . IUD check up 06/01/2019  . History of gestational diabetes 05/04/2019  . Morbid obesity (HCC) 11/18/2018    Past Surgical History:  Procedure Laterality Date  . CHOLECYSTECTOMY      OB History    Gravida  2   Para  2   Term  2   Preterm      AB      Living  2     SAB      TAB      Ectopic      Multiple  0   Live Births  2            Home Medications    Prior to Admission medications   Medication Sig Start Date End Date Taking? Authorizing Provider  fluticasone (FLONASE) 50  MCG/ACT nasal spray Place 1 spray into both nostrils daily. 08/11/19   Georgetta Haber, NP  naproxen (NAPROSYN) 500 MG tablet Take 1 tablet (500 mg total) by mouth 2 (two) times daily. 06/13/20   Eustace Moore, MD    Family History Family History  Problem Relation Age of Onset  . Parkinson's disease Father     Social History Social History   Tobacco Use  . Smoking status: Never Smoker  . Smokeless tobacco: Never Used  Vaping Use  . Vaping Use: Never used  Substance Use Topics  . Alcohol use: Not Currently  . Drug use: Never     Allergies   Patient has no known allergies.   Review of Systems Review of Systems See HPI Physical Exam Triage Vital Signs ED Triage Vitals  Enc Vitals Group     BP 06/13/20 1457 124/81     Pulse Rate 06/13/20 1457 71     Resp 06/13/20 1457 16     Temp 06/13/20 1457 98.7 F (37.1 C)     Temp  Source 06/13/20 1457 Oral     SpO2 06/13/20 1457 99 %     Weight --      Height --      Head Circumference --      Peak Flow --      Pain Score 06/13/20 1455 2     Pain Loc --      Pain Edu? --      Excl. in GC? --    No data found.  Updated Vital Signs BP 124/81 (BP Location: Left Arm)   Pulse 71   Temp 98.7 F (37.1 C) (Oral)   Resp 16   SpO2 99%     Physical Exam Constitutional:      General: She is not in acute distress.    Appearance: She is well-developed. She is obese.  HENT:     Head: Normocephalic and atraumatic.     Right Ear: Tympanic membrane and ear canal normal.     Left Ear: Tympanic membrane and ear canal normal.     Nose: Nose normal. No congestion.     Mouth/Throat:     Pharynx: No posterior oropharyngeal erythema.  Eyes:     Conjunctiva/sclera: Conjunctivae normal.     Pupils: Pupils are equal, round, and reactive to light.  Cardiovascular:     Rate and Rhythm: Normal rate and regular rhythm.     Heart sounds: Normal heart sounds.  Pulmonary:     Effort: Pulmonary effort is normal. No respiratory  distress.     Breath sounds: Normal breath sounds.     Comments: Mild increased pressure/pain reproduced by sternal pressure Chest:     Chest wall: Tenderness present.  Musculoskeletal:        General: Normal range of motion.     Cervical back: Normal range of motion and neck supple.  Lymphadenopathy:     Cervical: No cervical adenopathy.  Skin:    General: Skin is warm and dry.  Neurological:     General: No focal deficit present.     Mental Status: She is alert.  Psychiatric:        Mood and Affect: Mood normal.        Behavior: Behavior normal.      UC Treatments / Results  Labs (all labs ordered are listed, but only abnormal results are displayed) Labs Reviewed - No data to display  EKG normal rate and rhythm. Normal intervals. No ST or T wave changes   Radiology DG Chest 2 View  Result Date: 06/13/2020 CLINICAL DATA:  34 year old female with chest pain. EXAM: CHEST - 2 VIEW COMPARISON:  None. FINDINGS: The heart size and mediastinal contours are within normal limits. Both lungs are clear. The visualized skeletal structures are unremarkable. IMPRESSION: No active cardiopulmonary disease. Electronically Signed   By: Elgie Collard M.D.   On: 06/13/2020 16:02    Procedures Procedures (including critical care time)  Medications Ordered in UC Medications - No data to display  Initial Impression / Assessment and Plan / UC Course  I have reviewed the triage vital signs and the nursing notes.  Pertinent labs & imaging results that were available during my care of the patient were reviewed by me and considered in my medical decision making (see chart for details).     Chest x-ray is normal. EKG is normal. Discussed with patient differential diagnosis of chest pain. Chest wall pain versus lung versus GI versus cardiac versus "anxiety". We will treat with chest wall pain since  she does have some tenderness. Naproxen 500 twice daily for 7 days discussed deep breathing  and anxiety control follow-up with the PCP Final Clinical Impressions(s) / UC Diagnoses   Final diagnoses:  Chest wall pain     Discharge Instructions     Take naproxen 2 times a day with food  consider learning a meditation/deep breathing technique (YouTube)  consider establishing with PCP for preventative care    ED Prescriptions    Medication Sig Dispense Auth. Provider   naproxen (NAPROSYN) 500 MG tablet Take 1 tablet (500 mg total) by mouth 2 (two) times daily. 30 tablet Eustace Moore, MD     PDMP not reviewed this encounter.   Eustace Moore, MD 06/13/20 [phone]

## 2020-06-14 ENCOUNTER — Emergency Department (HOSPITAL_COMMUNITY)
Admission: EM | Admit: 2020-06-14 | Discharge: 2020-06-15 | Disposition: A | Discharge status: Home / Self Care | Payer: Medicaid Other | Attending: Emergency Medicine | Admitting: Emergency Medicine

## 2020-06-14 DIAGNOSIS — K529 Noninfective gastroenteritis and colitis, unspecified: Secondary | ICD-10-CM | POA: Insufficient documentation

## 2020-06-14 DIAGNOSIS — Z20822 Contact with and (suspected) exposure to covid-19: Secondary | ICD-10-CM | POA: Diagnosis not present

## 2020-06-14 DIAGNOSIS — R1084 Generalized abdominal pain: Secondary | ICD-10-CM | POA: Diagnosis not present

## 2020-06-14 DIAGNOSIS — R1010 Upper abdominal pain, unspecified: Secondary | ICD-10-CM | POA: Diagnosis present

## 2020-06-14 DIAGNOSIS — R52 Pain, unspecified: Secondary | ICD-10-CM | POA: Diagnosis not present

## 2020-06-14 DIAGNOSIS — R0789 Other chest pain: Secondary | ICD-10-CM | POA: Insufficient documentation

## 2020-06-14 DIAGNOSIS — Z79899 Other long term (current) drug therapy: Secondary | ICD-10-CM | POA: Insufficient documentation

## 2020-06-14 DIAGNOSIS — R079 Chest pain, unspecified: Secondary | ICD-10-CM | POA: Diagnosis not present

## 2020-06-14 DIAGNOSIS — I10 Essential (primary) hypertension: Secondary | ICD-10-CM | POA: Diagnosis not present

## 2020-06-14 DIAGNOSIS — R110 Nausea: Secondary | ICD-10-CM | POA: Diagnosis not present

## 2020-06-14 DIAGNOSIS — R1111 Vomiting without nausea: Secondary | ICD-10-CM | POA: Diagnosis not present

## 2020-06-14 LAB — COMPREHENSIVE METABOLIC PANEL
ALT: 41 U/L (ref 0–44)
AST: 41 U/L (ref 15–41)
Albumin: 3.9 g/dL (ref 3.5–5.0)
Alkaline Phosphatase: 59 U/L (ref 38–126)
Anion gap: 10 (ref 5–15)
BUN: 7 mg/dL (ref 6–20)
CO2: 22 mmol/L (ref 22–32)
Calcium: 9.2 mg/dL (ref 8.9–10.3)
Chloride: 106 mmol/L (ref 98–111)
Creatinine, Ser: 0.88 mg/dL (ref 0.44–1.00)
GFR calc Af Amer: >60 mL/min (ref >60)
GFR calc non Af Amer: >60 mL/min (ref >60)
Glucose, Bld: 181 mg/dL — ABNORMAL HIGH (ref 70–99)
Potassium: 3.3 mmol/L — ABNORMAL LOW (ref 3.5–5.1)
Sodium: 138 mmol/L (ref 135–145)
Total Bilirubin: 0.7 mg/dL (ref 0.3–1.2)
Total Protein: 6.9 g/dL (ref 6.5–8.1)

## 2020-06-14 LAB — URINALYSIS, ROUTINE W REFLEX MICROSCOPIC
Bilirubin Urine: NEGATIVE
Glucose, UA: NEGATIVE mg/dL
Ketones, ur: 5 mg/dL — AB
Nitrite: NEGATIVE
Protein, ur: 30 mg/dL — AB
Specific Gravity, Urine: 1.026 (ref 1.005–1.030)
pH: 6 (ref 5.0–8.0)

## 2020-06-14 LAB — CBC
HCT: 43.2 % (ref 36.0–46.0)
Hemoglobin: 14.2 g/dL (ref 12.0–15.0)
MCH: 29.8 pg (ref 26.0–34.0)
MCHC: 32.9 g/dL (ref 30.0–36.0)
MCV: 90.8 fL (ref 80.0–100.0)
Platelets: 343 10*3/uL (ref 150–400)
RBC: 4.76 MIL/uL (ref 3.87–5.11)
RDW: 12.6 % (ref 11.5–15.5)
WBC: 14.3 10*3/uL — ABNORMAL HIGH (ref 4.0–10.5)
nRBC: 0 % (ref 0.0–0.2)

## 2020-06-14 LAB — I-STAT BETA HCG BLOOD, ED (MC, WL, AP ONLY): I-stat hCG, quantitative: <5 m[IU]/mL (ref <5)

## 2020-06-14 LAB — LIPASE, BLOOD: Lipase: 24 U/L (ref 11–51)

## 2020-06-14 NOTE — ED Triage Notes (Signed)
Pt c/o upper abdominal pain x 1 week. Seen for same and given pain medication but was unable to take medication today d/t an episode of n/v/d, at which time she called EMS.

## 2020-06-15 ENCOUNTER — Emergency Department (HOSPITAL_COMMUNITY): Payer: Medicaid Other

## 2020-06-15 DIAGNOSIS — R079 Chest pain, unspecified: Secondary | ICD-10-CM | POA: Diagnosis not present

## 2020-06-15 LAB — SARS CORONAVIRUS 2 BY RT PCR (HOSPITAL ORDER, PERFORMED IN ~~LOC~~ HOSPITAL LAB): SARS Coronavirus 2: NEGATIVE

## 2020-06-15 MED ORDER — POTASSIUM CHLORIDE CRYS ER 20 MEQ PO TBCR
40.0000 meq | EXTENDED_RELEASE_TABLET | Freq: Once | ORAL | Status: AC
  Administered 2020-06-15: 40 meq via ORAL
  Filled 2020-06-15: qty 2

## 2020-06-15 MED ORDER — ONDANSETRON 4 MG PO TBDP
4.0000 mg | ORAL_TABLET | Freq: Three times a day (TID) | ORAL | 0 refills | Status: DC | PRN
Start: 2020-06-15 — End: 2020-10-18

## 2020-06-15 NOTE — Discharge Instructions (Addendum)
Today your chest x-ray and EKG were reassuring.   I suspect that your chest pressure is due to pain in the muscles and bones/joints in your chest wall.  Ibuprofen/naproxen/aleve can irritate your stomach.   You can  get Voltaren topical gel(or cream/ointment) which is available over-the-counter to put on the sides of your breast bone (sternum) where your chest hurts.  This is a topical form of ibuprofen/naproxen/Advil/aleve and can be used (as it will cause much less stomach upset) than those until your stomach is back to normal.  Do not use the voltaren if you are taking aleve/naproxen/advil/motrin/ibuprofen as these are all in the same class of medications.  You can use tylenol while using voltaren.  If you use voltaren do not put ice/heat on your chest until you wash it off.    After your stomach returns to normal stop using the voltaren and start the naproxen.   Please start taking Prilosec.  This is available over the counter to help with stomach acid and upset.

## 2020-06-15 NOTE — ED Provider Notes (Signed)
[facility]   CSN: [id number] Arrival date & time: 06/14/20  1336     History Chief Complaint  Patient presents with  . Abdominal Pain    Nichole Edwards is a 34 y.o. female with a past medical history of IUD, who presents today for evaluation of complaints.  She reports that for the past week she has had pain in her bilateral upper abdomen and lower chest to area above the umbilicus.  She went to urgent care a few days ago, had EKG and chest x-ray that were reportedly normal and was diagnosed with chest wall pain.  She reports that last night, shortly prior to arrival, she felt nauseous and had sudden onset of multiple bouts of vomiting and diarrhea.  She denies any blood in her vomit or diarrhea.  She reports that during this at 1 point she was hyperventilating and felt spasms in her fingers, toes, tingling around her mouth and felt lightheaded.  She currently reports that all of these symptoms except for the lower chest/upper abdominal pain have resolved.  She has a progesterone IUD.  No estrogen use.  No leg swelling, no hemoptysis, recent surgeries or immobilizations.  No shortness of breath.  No history of prior DVT/PE.  She has been able to drink water in the waiting room without vomiting.  She denies any dysuria, increased frequency or urgency.  She reports that her vaginal discharge has been slightly more mucousy than usual however she attributes that to her IUD, denies any pelvic pain, foul-smelling discharge, concern for STI.    Regarding her pain no aggravating or alleviating factors are noted.  Her pain has not radiated or moved since it started.   HPI     Past Medical History:  Diagnosis Date  . Gestational diabetes   . Medical history non-contributory     Patient Active Problem List   Diagnosis Date Noted  . IUD check up 06/01/2019  . History of gestational diabetes 05/04/2019  . Morbid obesity (HCC) 11/18/2018      Past Surgical History:  Procedure Laterality Date  . CHOLECYSTECTOMY       OB History    Gravida  2   Para  2   Term  2   Preterm      AB      Living  2     SAB      TAB      Ectopic      Multiple  0   Live Births  2           Family History  Problem Relation Age of Onset  . Parkinson's disease Father     Social History   Tobacco Use  . Smoking status: Never Smoker  . Smokeless tobacco: Never Used  Vaping Use  . Vaping Use: Never used  Substance Use Topics  . Alcohol use: Not Currently  . Drug use: Never    Home Medications Prior to Admission medications   Medication Sig Start Date End Date Taking? Authorizing Provider  fluticasone (FLONASE) 50 MCG/ACT nasal spray Place 1 spray into both nostrils daily. 08/11/19   Georgetta HaberBurky, Natalie B, NP  naproxen (NAPROSYN) 500 MG tablet Take 1 tablet (500 mg total) by mouth 2 (two) times daily. 06/13/20   Eustace MooreNelson, Yvonne Sue, MD  ondansetron (ZOFRAN ODT) 4 MG disintegrating tablet Take 1 tablet (4 mg total) by mouth every 8 (eight) hours as needed for nausea or vomiting. 06/15/20  Cristina Gong, PA-C    Allergies    Patient has no known allergies.  Review of Systems   Review of Systems  Constitutional: Negative for chills and fever.  HENT: Negative for congestion.   Respiratory: Positive for chest tightness. Negative for cough and shortness of breath.   Cardiovascular: Negative for chest pain, palpitations and leg swelling.  Gastrointestinal: Positive for abdominal pain, diarrhea, nausea and vomiting.  Genitourinary: Negative for decreased urine volume, difficulty urinating, dysuria, frequency, hematuria, urgency, vaginal bleeding and vaginal discharge.  Musculoskeletal: Negative for arthralgias, back pain, myalgias and neck pain.  Skin: Negative for color change and rash.  Neurological: Negative for tremors and weakness.  Psychiatric/Behavioral: Negative for confusion and self-injury.  All other  systems reviewed and are negative.   Physical Exam Updated Vital Signs BP 129/71 (BP Location: Right Arm)   Pulse (!) 59   Temp 98.4 F (36.9 C) (Oral)   Resp 15   SpO2 100%   Physical Exam Vitals and nursing note reviewed.  Constitutional:      General: She is not in acute distress.    Appearance: She is well-developed. She is not diaphoretic.  HENT:     Head: Normocephalic and atraumatic.  Eyes:     General: No scleral icterus.       Right eye: No discharge.        Left eye: No discharge.     Conjunctiva/sclera: Conjunctivae normal.  Cardiovascular:     Rate and Rhythm: Normal rate and regular rhythm.     Heart sounds: Normal heart sounds.  Pulmonary:     Effort: Pulmonary effort is normal. No respiratory distress.     Breath sounds: No stridor.  Chest:     Chest wall: Tenderness (Palpation with back of hand over sternocostal joints recreates and exacerbates her reported pain. ) present.  Abdominal:     General: There is no distension.     Tenderness: There is no abdominal tenderness. There is no guarding or rebound.     Hernia: No hernia is present.  Musculoskeletal:        General: No deformity.     Cervical back: Normal range of motion.  Skin:    General: Skin is warm and dry.  Neurological:     Mental Status: She is alert.     Motor: No abnormal muscle tone.  Psychiatric:        Behavior: Behavior normal.     ED Results / Procedures / Treatments   Labs (all labs ordered are listed, but only abnormal results are displayed) Labs Reviewed  COMPREHENSIVE METABOLIC PANEL - Abnormal; Notable for the following components:      Result Value   Potassium 3.3 (*)    Glucose, Bld 181 (*)    All other components within normal limits  CBC - Abnormal; Notable for the following components:   WBC 14.3 (*)    All other components within normal limits  URINALYSIS, ROUTINE W REFLEX MICROSCOPIC - Abnormal; Notable for the following components:   Color, Urine AMBER (*)      APPearance CLOUDY (*)    Hgb urine dipstick SMALL (*)    Ketones, ur 5 (*)    Protein, ur 30 (*)    Leukocytes,Ua SMALL (*)    Bacteria, UA FEW (*)    All other components within normal limits  SARS CORONAVIRUS 2 BY RT PCR (HOSPITAL ORDER, PERFORMED IN  HOSPITAL LAB)  LIPASE, BLOOD  I-STAT BETA HCG  BLOOD, ED ([facility], [facility], [facility] ONLY)    EKG None  Radiology DG Chest 2 View  Result Date: 06/15/2020 CLINICAL DATA:  Chest pain EXAM: CHEST - 2 VIEW COMPARISON:  Chest radiograph 06/13/2020 FINDINGS: The heart size and mediastinal contours are within normal limits. Both lungs are clear. No pleural effusion. No discernible pneumothorax. The visualized skeletal structures are unremarkable. Overall, similar appearance to prior. Cholecystectomy clips. IMPRESSION: No acute cardiopulmonary disease. Electronically Signed   By: Feliberto Harts MD   On: 06/15/2020 13:11   DG Chest 2 View  Result Date: 06/13/2020 CLINICAL DATA:  34 year old female with chest pain. EXAM: CHEST - 2 VIEW COMPARISON:  None. FINDINGS: The heart size and mediastinal contours are within normal limits. Both lungs are clear. The visualized skeletal structures are unremarkable. IMPRESSION: No active cardiopulmonary disease. Electronically Signed   By: Elgie Collard M.D.   On: 06/13/2020 16:02     Procedures Procedures (including critical care time)  Medications Ordered in ED Medications  potassium chloride SA (KLOR-CON) CR tablet 40 mEq (40 mEq Oral Given 06/15/20 1316)    ED Course  I have reviewed the triage vital signs and the nursing notes.  Pertinent labs & imaging results that were available during my care of the patient were reviewed by me and considered in my medical decision making (see chart for details).    MDM Rules/Calculators/A&P                         Patient is a 34 year old woman who presents today for evaluation of lower chest pressure along with an episode of nausea vomiting and diarrhea.   During the episode of vomiting and diarrhea she did have a presyncopal event however that fully resolved and she now feels better.   Labs are obtained and reviewed, she has mild hypokalemia which I suspect is secondary to GI loss.  Orally repleted.  No indication for continued prescription potassium at home, instructed on continued oral supplementation at home.  White count is elevated at 14.3 which I suspect is reactive to GI process.  Lipase is not elevated.  Her abdomen is soft nontender nondistended, she does not have a surgical abdomen.    Her chest pain is recreated with palpation over the sternal costal joints.  EKG without evidence of ischemia or other acute abnormalities.  Chest x-ray is unremarkable.  Suspect chest wall MSK pain.   Patient did have Covid in the beginning of this year, however is unvaccinated.  We will send Covid test to [facility] covid.    Given GI upset given rx for zofran.  Instructed to hold off on the previously prescribed NSAIDs for chest pain, instead use Voltaren until her stomach is returned to normal before taking oral NSAIDs.  Was able to p.o. challenge in the emergency room without antiemetics without significant difficulty.  Return precautions were discussed with patient who states their understanding.  At the time of discharge patient denied any unaddressed complaints or concerns.  Patient is agreeable for discharge home.  Note: Portions of this report may have been transcribed using voice recognition software. Every effort was made to ensure accuracy; however, inadvertent computerized transcription errors may be present  Final Clinical Impression(s) / ED Diagnoses Final diagnoses:  Gastroenteritis  Chest wall pain    Rx / DC Orders ED Discharge Orders         Ordered    ondansetron (ZOFRAN ODT) 4 MG disintegrating tablet  Every 8  hours PRN        06/15/20 1322           Norman Clay 06/15/20 1327    Raeford Razor, MD 06/18/20  [phone]

## 2020-06-15 NOTE — ED Notes (Signed)
Got patient into a gown patient is resting with call bell in reach 

## 2020-07-05 ENCOUNTER — Other Ambulatory Visit: Payer: Self-pay

## 2020-07-05 ENCOUNTER — Other Ambulatory Visit (HOSPITAL_COMMUNITY)
Admission: RE | Admit: 2020-07-05 | Discharge: 2020-07-05 | Disposition: A | Discharge status: Home / Self Care | Payer: Medicaid Other | Source: Ambulatory Visit | Attending: Obstetrics | Admitting: Obstetrics

## 2020-07-05 ENCOUNTER — Encounter: Payer: Self-pay | Admitting: Obstetrics

## 2020-07-05 ENCOUNTER — Ambulatory Visit (INDEPENDENT_AMBULATORY_CARE_PROVIDER_SITE_OTHER): Payer: Medicaid Other | Admitting: Obstetrics

## 2020-07-05 VITALS — BP 134/86 | HR 73 | Ht 63.0 in | Wt 251.2 lb

## 2020-07-05 DIAGNOSIS — Z01419 Encounter for gynecological examination (general) (routine) without abnormal findings: Secondary | ICD-10-CM | POA: Diagnosis not present

## 2020-07-05 DIAGNOSIS — R102 Pelvic and perineal pain unspecified side: Secondary | ICD-10-CM

## 2020-07-05 DIAGNOSIS — Z30431 Encounter for routine checking of intrauterine contraceptive device: Secondary | ICD-10-CM | POA: Diagnosis not present

## 2020-07-05 NOTE — Progress Notes (Signed)
Patient presents for IUD check. She states that she can not feel her strings any more. She says that she started having bleeding after she could no locate her strings anymore. She does complain of having left side pain that is consistent. IUD was placed 05/04/19. She declines STD testing. No vaginal discharge, odor, or irritation.  Last pap: 10/2018 Normal

## 2020-07-05 NOTE — Progress Notes (Addendum)
Subjective:        Nichole Edwards is a 34 y.o. female here for a routine exam.  Current complaints: Pelvic pain and change in menstrual cycles.    Personal health questionnaire:  Is patient Ashkenazi Jewish, have a family history of breast and/or ovarian cancer: no Is there a family history of uterine cancer diagnosed at age < 79, gastrointestinal cancer, urinary tract cancer, family member who is a Personnel officer syndrome-associated carrier: no Is the patient overweight and hypertensive, family history of diabetes, personal history of gestational diabetes, preeclampsia or PCOS: no Is patient over 53, have PCOS,  family history of premature CHD under age 19, diabetes, smoke, have hypertension or peripheral artery disease:  no At any time, has a partner hit, kicked or otherwise hurt or frightened you?: no Over the past 2 weeks, have you felt down, depressed or hopeless?: no Over the past 2 weeks, have you felt little interest or pleasure in doing things?:no   Gynecologic History Patient's last menstrual period was 07/01/2020. Contraception: IUD Last Pap: 11-18-2018. Results were: normal Last mammogram: n/a. Results were: n/a  Obstetric History OB History  Gravida Para Term Preterm AB Living  2 2 2     2   SAB TAB Ectopic Multiple Live Births        0 2    # Outcome Date GA Lbr Len/2nd Weight Sex Delivery Anes PTL Lv  2 Term 04/06/19 [redacted]w[redacted]d 12:39 / 00:12 8 lb 9.7 oz (3.905 kg) M Vag-Spont EPI  LIV     Birth Comments: NA  1 Term 05/04/08 [redacted]w[redacted]d  8 lb 10 oz (3.912 kg)  Vag-Spont       Past Medical History:  Diagnosis Date  . Gestational diabetes   . Medical history non-contributory     Past Surgical History:  Procedure Laterality Date  . CHOLECYSTECTOMY       Current Outpatient Medications:  .  fluticasone (FLONASE) 50 MCG/ACT nasal spray, Place 1 spray into both nostrils daily., Disp: 16 g, Rfl: 2 .  naproxen (NAPROSYN) 500 MG tablet, Take 1 tablet (500 mg total) by mouth 2 (two)  times daily., Disp: 30 tablet, Rfl: 0 .  ondansetron (ZOFRAN ODT) 4 MG disintegrating tablet, Take 1 tablet (4 mg total) by mouth every 8 (eight) hours as needed for nausea or vomiting., Disp: 10 tablet, Rfl: 0 No Known Allergies  Social History   Tobacco Use  . Smoking status: Never Smoker  . Smokeless tobacco: Never Used  Substance Use Topics  . Alcohol use: Not Currently    Family History  Problem Relation Age of Onset  . Parkinson's disease Father       Review of Systems  Constitutional: negative for fatigue and weight loss Respiratory: negative for cough and wheezing Cardiovascular: negative for chest pain, fatigue and palpitations Gastrointestinal: negative for abdominal pain and change in bowel habits Musculoskeletal:negative for myalgias Neurological: negative for gait problems and tremors Behavioral/Psych: negative for abusive relationship, depression Endocrine: negative for temperature intolerance    Genitourinary: positive for abnormal menstrual periods and pelvic pain,  Negative forgenital lesions, hot flashes, sexual problems and vaginal discharge.   Integument/breast: negative for breast lump, breast tenderness, nipple discharge and skin lesion(s)    Objective:       BP 134/86   Pulse 73   Ht 5\' 3"  (1.6 m)   Wt 251 lb 3.2 oz (113.9 kg)   LMP 07/01/2020   BMI 44.50 kg/m  General:   alert and no  distress  Skin:   no rash or abnormalities  Lungs:   clear to auscultation bilaterally  Heart:   regular rate and rhythm, S1, S2 normal, no murmur, click, rub or gallop  Breasts:   normal without suspicious masses, skin or nipple changes or axillary nodes  Abdomen:  normal findings: no organomegaly, soft, non-tender and no hernia  Pelvis:  External genitalia: normal general appearance Urinary system: urethral meatus normal and bladder without fullness, nontender Vaginal: normal without tenderness, induration or masses Cervix: normal appearance Adnexa: normal  bimanual exam Uterus: anteverted and non-tender, normal size   Lab Review Urine pregnancy test Labs reviewed yes Radiologic studies reviewed no  50% of 20 min visit spent on counseling and coordination of care.   Assessment:     1. Encounter for gynecological examination with Papanicolaou smear of cervix Rx: - Cytology - PAP( [facility])  2. Pelvic pain Rx: - US PELVIC COMPLETE WITH TRANSVAGINAL; Future  3. IUD check up - pleased with IUD but bleeding pattern has changed from amenorrhea after insertion last July to having periods for the past 2 months   Plan:    Education reviewed: calcium supplements, depression evaluation, low fat, low cholesterol diet, safe sex/STD prevention, self breast exams and weight bearing exercise. Follow up in: 1 year.     Brock Bad, MD 07/05/2020 9:23 AM

## 2020-07-06 LAB — CYTOLOGY - PAP
Comment: NEGATIVE
Diagnosis: NEGATIVE
High risk HPV: NEGATIVE

## 2020-07-13 ENCOUNTER — Other Ambulatory Visit: Payer: Self-pay

## 2020-07-13 ENCOUNTER — Ambulatory Visit
Admission: RE | Admit: 2020-07-13 | Discharge: 2020-07-13 | Disposition: A | Discharge status: Home / Self Care | Payer: Medicaid Other | Source: Ambulatory Visit | Attending: Obstetrics | Admitting: Obstetrics

## 2020-07-13 DIAGNOSIS — R102 Pelvic and perineal pain unspecified side: Secondary | ICD-10-CM

## 2020-07-13 DIAGNOSIS — N854 Malposition of uterus: Secondary | ICD-10-CM | POA: Diagnosis not present

## 2020-07-13 DIAGNOSIS — R1032 Left lower quadrant pain: Secondary | ICD-10-CM | POA: Diagnosis not present

## 2020-07-24 ENCOUNTER — Telehealth: Payer: Medicaid Other | Admitting: Obstetrics

## 2020-07-27 ENCOUNTER — Encounter: Payer: Self-pay | Admitting: Obstetrics

## 2020-07-27 ENCOUNTER — Other Ambulatory Visit: Payer: Self-pay

## 2020-07-27 ENCOUNTER — Ambulatory Visit (INDEPENDENT_AMBULATORY_CARE_PROVIDER_SITE_OTHER): Payer: Medicaid Other | Admitting: Obstetrics

## 2020-07-27 VITALS — BP 115/83 | HR 72 | Wt 250.0 lb

## 2020-07-27 DIAGNOSIS — R300 Dysuria: Secondary | ICD-10-CM | POA: Diagnosis not present

## 2020-07-27 DIAGNOSIS — Z30433 Encounter for removal and reinsertion of intrauterine contraceptive device: Secondary | ICD-10-CM

## 2020-07-27 DIAGNOSIS — Z01812 Encounter for preprocedural laboratory examination: Secondary | ICD-10-CM | POA: Diagnosis not present

## 2020-07-27 LAB — POCT URINALYSIS DIPSTICK
Bilirubin, UA: NEGATIVE
Glucose, UA: NEGATIVE
Ketones, UA: NEGATIVE
Leukocytes, UA: NEGATIVE
Nitrite, UA: NEGATIVE
Protein, UA: POSITIVE — AB
Spec Grav, UA: >=1.03 — AB (ref 1.010–1.025)
Urobilinogen, UA: NEGATIVE E.U./dL — AB
pH, UA: 6 (ref 5.0–8.0)

## 2020-07-27 LAB — POCT URINE PREGNANCY: Preg Test, Ur: NEGATIVE

## 2020-07-27 NOTE — Progress Notes (Addendum)
[facility]   GYNECOLOGY OFFICE PROCEDURE NOTE  Margurite Duffy is a 34 y.o. [phone] here for Liletta IUD removal and for Mirena IUD reinsertion. No GYN concerns.  Last pap smear was on 07-05-2020 and was normal.  IUD Removal and Reinsertion  Patient identified, informed consent performed, consent signed.   Discussed risks of irregular bleeding, cramping, infection, malpositioning or misplacement of the IUD outside the uterus which may require further procedures. Also discussed >99% contraception efficacy, increased risk of ectopic pregnancy with failure of method.   Emphasized that this did not protect against STIs, condoms recommended during all sexual encounters.Advised to use backup contraception for one week as the risk of pregnancy is higher during the transition period of removing an IUD and replacing it with another one. Time out was performed. Speculum placed in the vagina. The strings of the IUD were grasped and pulled using ring forceps. The IUD was successfully removed in its entirety. The cervix was cleaned with Betadine x 2 and grasped anteriorly with a single tooth tenaculum.  The uterine sound was used to sound the uterus to 9 cm;  the IUD was then placed per manufacturer's recommendations. Strings trimmed to 3 cm. Tenaculum was removed, good hemostasis noted. Patient tolerated procedure well.   IUD:  Mirena Lot #: [id number] Expiration Date: DEC  2023  [name].  She was reminded to have backup contraception for one week during this transition period between IUDs.  Patient was also asked to check IUD strings periodically and follow up in 6 weeks for IUD check.   Brock Bad, MD, FACOG Obstetrician & Gynecologist, [facility] for [facility], [facility] 07/27/20

## 2020-07-30 LAB — URINE CULTURE

## 2020-08-03 MED ORDER — LEVONORGESTREL 20 MCG/24HR IU IUD: INTRAUTERINE_SYSTEM | Freq: Once | INTRAUTERINE | Status: AC

## 2020-08-03 NOTE — Addendum Note (Signed)
Addended by: Marya Landry D on: 08/03/2020 01:46 PM   Modules accepted: Orders

## 2020-09-07 ENCOUNTER — Encounter: Payer: Self-pay | Admitting: Obstetrics

## 2020-09-07 ENCOUNTER — Ambulatory Visit (INDEPENDENT_AMBULATORY_CARE_PROVIDER_SITE_OTHER): Payer: Medicaid Other | Admitting: Obstetrics

## 2020-09-07 VITALS — BP 117/81 | HR 80 | Ht 63.0 in | Wt 252.8 lb

## 2020-09-07 DIAGNOSIS — Z30431 Encounter for routine checking of intrauterine contraceptive device: Secondary | ICD-10-CM

## 2020-09-07 NOTE — Progress Notes (Signed)
Subjective:    Nichole Edwards  who presents for IUD string check. The patient has no complaints today. The patient is sexually active. Pertinent past medical history: none.  Likes her Mirena IUD.  Is not currently bleeding with the IUD.    The information documented in the HPI was reviewed and verified.  Menstrual History: OB History    Gravida Para Term Preterm AB Living   1 1 1     1    SAB TAB Ectopic Multiple Live Births         0 1      No LMP recorded. has Mirena IUD   Patient Active Problem List   Diagnosis Date Noted   Patient Active Problem List   Diagnosis Date Noted  . IUD check up 06/01/2019  . History of gestational diabetes 05/04/2019  . Morbid obesity (HCC) 11/18/2018    No past medical history on file.  Past Surgical History:  Procedure Laterality Date   Past Surgical History:  Procedure Laterality Date  . CHOLECYSTECTOMY        Current Outpatient Prescriptions:  No medication comments found. Current Outpatient Medications on File Prior to Visit  Medication Sig Dispense Refill  . fluticasone (FLONASE) 50 MCG/ACT nasal spray Place 1 spray into both nostrils daily. (Patient not taking: Reported on 09/07/2020) 16 g 2  . naproxen (NAPROSYN) 500 MG tablet Take 1 tablet (500 mg total) by mouth 2 (two) times daily. 30 tablet 0  . ondansetron (ZOFRAN ODT) 4 MG disintegrating tablet Take 1 tablet (4 mg total) by mouth every 8 (eight) hours as needed for nausea or vomiting. 10 tablet 0   No current facility-administered medications on file prior to visit.    Allergies  Allergen Reactions  No Known Allergies   Social History  Substance Use Topics   Social History   Socioeconomic History  . Marital status: Single    Spouse name: Not on file  . Number of children: Not on file  . Years of education: Not on file  . Highest education level: Not on file  Occupational History  . Not on file  Tobacco Use  . Smoking status: Never Smoker  . Smokeless  tobacco: Never Used  Vaping Use  . Vaping Use: Never used  Substance and Sexual Activity  . Alcohol use: Not Currently  . Drug use: Never  . Sexual activity: Yes    Partners: Male    Birth control/protection: I.U.D.  Other Topics Concern  . Not on file  Social History Narrative  . Not on file   Social Determinants of Health   Financial Resource Strain:   . Difficulty of Paying Living Expenses: Not on file  Food Insecurity:   . Worried About 13/08/2020 in the Last Year: Not on file  . Ran Out of Food in the Last Year: Not on file  Transportation Needs:   . Lack of Transportation (Medical): Not on file  . Lack of Transportation (Non-Medical): Not on file  Physical Activity:   . Days of Exercise per Week: Not on file  . Minutes of Exercise per Session: Not on file  Stress:   . Feeling of Stress : Not on file  Social Connections:   . Frequency of Communication with Friends and Family: Not on file  . Frequency of Social Gatherings with Friends and Family: Not on file  . Attends Religious Services: Not on file  . Active Member of Clubs or Organizations: Not on file  .  Attends Banker Meetings: Not on file  . Marital Status: Not on file  Intimate Partner Violence:   . Fear of Current or Ex-Partner: Not on file  . Emotionally Abused: Not on file  . Physically Abused: Not on file  . Sexually Abused: Not on file     No family history on file.     Review of Systems Constitutional: negative for weight loss Genitourinary:negative for abnormal menstrual periods and vaginal discharge   Objective:   BP 115/77   Pulse 89   Wt 161 lb (73 kg)   BMI 25.22 kg/m    Pelvis:  External genitalia: normal general appearance Urinary system: urethral meatus normal and bladder without fullness, nontender Vaginal: normal without tenderness, induration or masses Cervix: normal appearance, IUD strings present Adnexa: normal bimanual exam Uterus: anteverted and  non-tender, normal size   Lab Review Urine pregnancy test Labs reviewed yes Radiologic studies reviewed no  50% of 15 min visit spent on counseling and coordination of care.    Assessment:    34 y.o., continuing Mirena IUD, no contraindications.   Plan:    All questions answered.  Follow up as needed or in 1 year for annual exam.  Brock Bad, MD 09/07/2020 10:36 AM

## 2020-09-07 NOTE — Progress Notes (Signed)
Pt is in the office for IUD string check, inserted on 07-27-20.

## 2020-10-05 ENCOUNTER — Ambulatory Visit (INDEPENDENT_AMBULATORY_CARE_PROVIDER_SITE_OTHER): Payer: BC Managed Care – PPO | Admitting: Nurse Practitioner

## 2020-10-05 VITALS — BP 110/72 | HR 80 | Temp 97.5°F | Ht 63.0 in | Wt 257.0 lb

## 2020-10-05 DIAGNOSIS — R41840 Attention and concentration deficit: Secondary | ICD-10-CM

## 2020-10-05 DIAGNOSIS — R439 Unspecified disturbances of smell and taste: Secondary | ICD-10-CM | POA: Diagnosis not present

## 2020-10-05 DIAGNOSIS — G8929 Other chronic pain: Secondary | ICD-10-CM

## 2020-10-05 DIAGNOSIS — R0789 Other chest pain: Secondary | ICD-10-CM | POA: Diagnosis not present

## 2020-10-05 DIAGNOSIS — R7309 Other abnormal glucose: Secondary | ICD-10-CM | POA: Diagnosis not present

## 2020-10-05 DIAGNOSIS — Z8616 Personal history of COVID-19: Secondary | ICD-10-CM | POA: Diagnosis not present

## 2020-10-05 DIAGNOSIS — R0683 Snoring: Secondary | ICD-10-CM | POA: Insufficient documentation

## 2020-10-05 DIAGNOSIS — U099 Post covid-19 condition, unspecified: Secondary | ICD-10-CM

## 2020-10-05 DIAGNOSIS — R438 Other disturbances of smell and taste: Secondary | ICD-10-CM

## 2020-10-05 DIAGNOSIS — R519 Headache, unspecified: Secondary | ICD-10-CM | POA: Diagnosis not present

## 2020-10-05 MED ORDER — OMEPRAZOLE 20 MG PO CPDR
20.0000 mg | DELAYED_RELEASE_CAPSULE | Freq: Every day | ORAL | 3 refills | Status: DC
Start: 2020-10-05 — End: 2022-01-25

## 2020-10-05 NOTE — Patient Instructions (Addendum)
History of covid Altered taste and smell Snoring Concentration deficit:   Stay well hydrated  Stay active  Will place referral to neurology - Dr. Vickey Huger - altered taste and smell - snoring / headaches - may need sleep eval  Will place referral to speech therapy - cognitive rehabilitation  May try on-line olfactory re-training wil essential oils  Hand out given on memory care  Chest tightness:  Recent work-up clear  Will trial Prilosec  Will check labs including TSH  Headaches:  Magnesium 600 mg daily   History of elevated blood sugar and gestational diabetes:  Will schedule appointment to establish care with PCP  Will order labs  Follow up:  Follow up in 1 month or sooner if needed - follow up on prilosec / chest tightness

## 2020-10-05 NOTE — Assessment & Plan Note (Signed)
Altered taste and smell Snoring Concentration deficit:   Stay well hydrated  Stay active  Will place referral to neurology - Dr. Vickey Huger - altered taste and smell - snoring / headaches - may need sleep eval  Will place referral to speech therapy - cognitive rehabilitation  May try on-line olfactory re-training wil essential oils  Hand out given on memory care  Chest tightness:  Recent work-up clear  Will trial Prilosec  Will check labs including TSH  Headaches:  Magnesium 600 mg daily   History of elevated blood sugar and gestational diabetes:  Will schedule appointment to establish care with PCP  Will order labs  Follow up:  Follow up in 1 month or sooner if needed - follow up on prilosec / chest tightness

## 2020-10-05 NOTE — Progress Notes (Signed)
@Patient  ID: , female    DOB: 31-Jan-1986, 34 y.o.   MRN: [id number]  Chief Complaint  Patient presents with  . New Patient (Initial Visit)    COVID 10/2019, Taste and smell comes and goes, headaches, brain fog.     Referring provider: No ref. provider found   34 year old female with no significant health history other than gestational diabetes with last pregnancy.  HPI  Patient presents today for post COVID care clinic visit.  Patient was diagnosed with Covid in January 2021.  She complains today of ongoing altered taste and smell.  She states that she has been experiencing a chemical taste and certain things smell like ammonia.  She states that her taste and smell comes and goes is not fully recovered since she had Covid.  Patient also complains of headaches, and brain fog.  She states that she recently cannot remember the name of her niece.  Patient states that she does snore at night.  Patient does complain of intermittent chest tightness and pressure that started in August.  She did go to the ED and had cardiac work-up during that visit which was negative.  Patient was told it was most likely due to indigestion/GERD.  Patient has not currently on any antiacid.  Patient does not currently have a primary care physician has not had any lab work done recently.  We discussed that we will check lab work today including TSH.  At her ED visit in August her blood sugar was noted to be elevated.  Patient does have a history of gestational diabetes.  We will check hemoglobin A1c today.  We will schedule appointment for patient to establish care with new PCP. Denies f/c/s, n/v/d, hemoptysis, PND, chest pain or edema.      No Known Allergies  Immunization History  Administered Date(s) Administered  . Tdap 03/17/2019    Past Medical History:  Diagnosis Date  . Gestational diabetes   . Medical history non-contributory     Tobacco History: Social History   Tobacco Use  Smoking  Status Never Smoker  Smokeless Tobacco Never Used   Counseling given: Yes   Outpatient Encounter Medications as of 10/05/2020  Medication Sig  . fluticasone (FLONASE) 50 MCG/ACT nasal spray Place 1 spray into both nostrils daily.  . naproxen (NAPROSYN) 500 MG tablet Take 1 tablet (500 mg total) by mouth 2 (two) times daily.  14/06/2020 omeprazole (PRILOSEC) 20 MG capsule Take 1 capsule (20 mg total) by mouth daily.  . ondansetron (ZOFRAN ODT) 4 MG disintegrating tablet Take 1 tablet (4 mg total) by mouth every 8 (eight) hours as needed for nausea or vomiting.   No facility-administered encounter medications on file as of 10/05/2020.     Review of Systems  Review of Systems  Constitutional: Negative.  Negative for fatigue and fever.  HENT: Negative.   Respiratory: Negative for cough and shortness of breath.   Cardiovascular: Negative.  Negative for chest pain, palpitations and leg swelling.  Gastrointestinal: Negative.   Allergic/Immunologic: Negative.   Neurological: Negative.        Altered taste and smell  Psychiatric/Behavioral: Positive for decreased concentration.       Physical Exam  BP 110/72 (BP Location: Left Arm)   Pulse 80   Temp (!) 97.5 F (36.4 C)   Ht 5\' 3"  (1.6 m)   Wt 257 lb (116.6 kg)   SpO2 97%   BMI 45.53 kg/m   Wt Readings from Last 5 Encounters:  10/05/20  257 lb (116.6 kg)  09/07/20 252 lb 12.8 oz (114.7 kg)  07/27/20 250 lb (113.4 kg)  07/05/20 251 lb 3.2 oz (113.9 kg)  06/01/19 236 lb 14.4 oz (107.5 kg)     Physical Exam Vitals and nursing note reviewed.  Constitutional:      General: She is not in acute distress.    Appearance: She is well-developed and well-nourished.  Cardiovascular:     Rate and Rhythm: Normal rate and regular rhythm.  Pulmonary:     Effort: Pulmonary effort is normal.     Breath sounds: Normal breath sounds.  Neurological:     Mental Status: She is alert and oriented to person, place, and time.  Psychiatric:         Mood and Affect: Mood and affect normal.        Assessment & Plan:   History of COVID-19 Altered taste and smell Snoring Concentration deficit:   Stay well hydrated  Stay active  Will place referral to neurology - Dr. Vickey Huger - altered taste and smell - snoring / headaches - may need sleep eval  Will place referral to speech therapy - cognitive rehabilitation  May try on-line olfactory re-training wil essential oils  Hand out given on memory care  Chest tightness:  Recent work-up clear  Will trial Prilosec  Will check labs including TSH  Headaches:  Magnesium 600 mg daily   History of elevated blood sugar and gestational diabetes:  Will schedule appointment to establish care with PCP  Will order labs  Follow up:  Follow up in 1 month or sooner if needed - follow up on prilosec / chest tightness       Ivonne Andrew, NP 10/05/2020

## 2020-10-06 LAB — CBC
Hematocrit: 38.1 % (ref 34.0–46.6)
Hemoglobin: 13 g/dL (ref 11.1–15.9)
MCH: 30.7 pg (ref 26.6–33.0)
MCHC: 34.1 g/dL (ref 31.5–35.7)
MCV: 90 fL (ref 79–97)
Platelets: 304 10*3/uL (ref 150–450)
RBC: 4.24 x10E6/uL (ref 3.77–5.28)
RDW: 12.9 % (ref 11.7–15.4)
WBC: 12.7 10*3/uL — ABNORMAL HIGH (ref 3.4–10.8)

## 2020-10-06 LAB — THYROID PANEL WITH TSH
Free Thyroxine Index: 1.7 (ref 1.2–4.9)
T3 Uptake Ratio: 22 % — ABNORMAL LOW (ref 24–39)
T4, Total: 7.7 ug/dL (ref 4.5–12.0)
TSH: 4.35 u[IU]/mL (ref 0.450–4.500)

## 2020-10-06 LAB — COMPREHENSIVE METABOLIC PANEL
ALT: 23 IU/L (ref 0–32)
AST: 18 IU/L (ref 0–40)
Albumin/Globulin Ratio: 1.9 (ref 1.2–2.2)
Albumin: 4 g/dL (ref 3.8–4.8)
Alkaline Phosphatase: 64 IU/L (ref 44–121)
BUN/Creatinine Ratio: 12 (ref 9–23)
BUN: 10 mg/dL (ref 6–20)
Bilirubin Total: <0.2 mg/dL (ref 0.0–1.2)
CO2: 23 mmol/L (ref 20–29)
Calcium: 9.3 mg/dL (ref 8.7–10.2)
Chloride: 106 mmol/L (ref 96–106)
Creatinine, Ser: 0.81 mg/dL (ref 0.57–1.00)
GFR calc Af Amer: 110 mL/min/{1.73_m2} (ref >59)
GFR calc non Af Amer: 96 mL/min/{1.73_m2} (ref >59)
Globulin, Total: 2.1 g/dL (ref 1.5–4.5)
Glucose: 97 mg/dL (ref 65–99)
Potassium: 4.6 mmol/L (ref 3.5–5.2)
Sodium: 140 mmol/L (ref 134–144)
Total Protein: 6.1 g/dL (ref 6.0–8.5)

## 2020-10-06 LAB — HEMOGLOBIN A1C
Est. average glucose Bld gHb Est-mCnc: 114 mg/dL
Hgb A1c MFr Bld: 5.6 % (ref 4.8–5.6)

## 2020-10-18 ENCOUNTER — Encounter: Payer: Self-pay | Admitting: Nurse Practitioner

## 2020-10-18 ENCOUNTER — Ambulatory Visit (INDEPENDENT_AMBULATORY_CARE_PROVIDER_SITE_OTHER): Payer: BC Managed Care – PPO | Admitting: Nurse Practitioner

## 2020-10-18 ENCOUNTER — Other Ambulatory Visit: Payer: Self-pay

## 2020-10-18 VITALS — BP 118/65 | HR 80 | Temp 98.2°F | Resp 20 | Ht 63.0 in | Wt 256.0 lb

## 2020-10-18 DIAGNOSIS — Z1159 Encounter for screening for other viral diseases: Secondary | ICD-10-CM | POA: Diagnosis not present

## 2020-10-18 DIAGNOSIS — R29818 Other symptoms and signs involving the nervous system: Secondary | ICD-10-CM

## 2020-10-18 DIAGNOSIS — Z7689 Persons encountering health services in other specified circumstances: Secondary | ICD-10-CM

## 2020-10-18 LAB — POCT URINALYSIS DIP (CLINITEK)
Bilirubin, UA: NEGATIVE
Glucose, UA: NEGATIVE mg/dL
Ketones, POC UA: NEGATIVE mg/dL
Leukocytes, UA: NEGATIVE
Nitrite, UA: NEGATIVE
POC PROTEIN,UA: NEGATIVE
Spec Grav, UA: 1.02 (ref 1.010–1.025)
Urobilinogen, UA: 0.2 E.U./dL
pH, UA: 7.5 (ref 5.0–8.0)

## 2020-10-18 LAB — POCT GLYCOSYLATED HEMOGLOBIN (HGB A1C): Hemoglobin A1C: 5.6 % (ref 4.0–5.6)

## 2020-10-18 NOTE — Progress Notes (Signed)
Needs referral for neurology

## 2020-10-18 NOTE — Progress Notes (Addendum)
New Patient Office Visit  Subjective:  Patient ID: Nichole Edwards, female    DOB: 1985/11/20  Age: 34 y.o. MRN: [id number]  CC:  Chief Complaint  Patient presents with  . Establish Care    HPI [name] presents for follow up. She  has a past medical history of Gestational diabetes and Medical history non-contributory.    She is post COV-19 infection. She is having some long hauler symptoms.   She continues to have increased pressure in her chest. She feels like this has been since her COV-19 encounter however it is not resolving  She is concern about protein in her urine. This was found while having a routine UA and pregnancy test.  She is concern about this. She has frequency. She denies dysuria,  UTI symptoms.  She has some sleeping problems.   Past Medical History:  Diagnosis Date  . Gestational diabetes   . Medical history non-contributory     Past Surgical History:  Procedure Laterality Date  . CHOLECYSTECTOMY      Family History  Problem Relation Age of Onset  . Parkinson's disease Father     Social History   Socioeconomic History  . Marital status: Single    Spouse name: Not on file  . Number of children: Not on file  . Years of education: Not on file  . Highest education level: Not on file  Occupational History  . Not on file  Tobacco Use  . Smoking status: Never Smoker  . Smokeless tobacco: Never Used  Vaping Use  . Vaping Use: Never used  Substance and Sexual Activity  . Alcohol use: Not Currently  . Drug use: Never  . Sexual activity: Yes    Partners: Male    Birth control/protection: I.U.D.  Other Topics Concern  . Not on file  Social History Narrative  . Not on file   Social Determinants of Health   Financial Resource Strain: Not on file  Food Insecurity: Not on file  Transportation Needs: Not on file  Physical Activity: Not on file  Stress: Not on file  Social Connections: Not on file  Intimate Partner Violence: Not on file     ROS Review of Systems  Cardiovascular:       Midsternum chest pain  Gastrointestinal:       GERD worse with caffeine and tomato based products. Not seen with spices     Objective:   Today's Vitals: BP 118/65 (BP Location: Left Arm, Patient Position: Sitting, Cuff Size: Large)   Pulse 80   Temp 98.2 F (36.8 C) (Temporal)   Resp 20   Ht 5\' 3"  (1.6 m)   Wt 256 lb (116.1 kg)   SpO2 100%   BMI 45.35 kg/m   Physical Exam Constitutional:      General: She is not in acute distress.    Appearance: She is obese. She is not ill-appearing, toxic-appearing or diaphoretic.  HENT:     Nose: Nose normal.     Mouth/Throat:     Mouth: Mucous membranes are moist.  Cardiovascular:     Rate and Rhythm: Normal rate and regular rhythm.     Pulses: Normal pulses.     Heart sounds: Normal heart sounds.  Pulmonary:     Effort: Pulmonary effort is normal.     Breath sounds: Normal breath sounds.  Abdominal:     General: Bowel sounds are normal.     Palpations: Abdomen is soft.  Musculoskeletal:  General: Normal range of motion.     Cervical back: Normal range of motion.  Skin:    General: Skin is warm and dry.     Capillary Refill: Capillary refill takes less than 2 seconds.  Neurological:     General: No focal deficit present.     Mental Status: She is alert and oriented to person, place, and time.  Psychiatric:        Mood and Affect: Mood normal.        Behavior: Behavior normal.        Thought Content: Thought content normal.        Judgment: Judgment normal.     Assessment & Plan:   Problem List Items Addressed This Visit   None   Visit Diagnoses    Encounter to establish care    -  Primary Female health maintenance discussed   Relevant Orders   POCT URINALYSIS DIP (CLINITEK) (Completed)   HgB A1c (Completed)   Encounter for hepatitis C screening test for low risk patient       Suspected sleep apnea       Relevant Orders   PSG Sleep Study       Outpatient Encounter Medications as of 10/18/2020  Medication Sig  . fluticasone (FLONASE) 50 MCG/ACT nasal spray Place 1 spray into both nostrils daily.  Marland Kitchen omeprazole (PRILOSEC) 20 MG capsule Take 1 capsule (20 mg total) by mouth daily.  . [DISCONTINUED] naproxen (NAPROSYN) 500 MG tablet Take 1 tablet (500 mg total) by mouth 2 (two) times daily.  . [DISCONTINUED] ondansetron (ZOFRAN ODT) 4 MG disintegrating tablet Take 1 tablet (4 mg total) by mouth every 8 (eight) hours as needed for nausea or vomiting.   No facility-administered encounter medications on file as of 10/18/2020.    Follow-up: Return in about 6 months (around 04/18/2021) for follow up as needed.   Barbette Merino, NP

## 2020-10-18 NOTE — Patient Instructions (Signed)
Health Maintenance, Female Adopting a healthy lifestyle and getting preventive care are important in promoting health and wellness. Ask your health care provider about:  The right schedule for you to have regular tests and exams.  Things you can do on your own to prevent diseases and keep yourself healthy. What should I know about diet, weight, and exercise? Eat a healthy diet   Eat a diet that includes plenty of vegetables, fruits, low-fat dairy products, and lean protein.  Do not eat a lot of foods that are high in solid fats, added sugars, or sodium. Maintain a healthy weight Body mass index (BMI) is used to identify weight problems. It estimates body fat based on height and weight. Your health care provider can help determine your BMI and help you achieve or maintain a healthy weight. Get regular exercise Get regular exercise. This is one of the most important things you can do for your health. Most adults should:  Exercise for at least 150 minutes each week. The exercise should increase your heart rate and make you sweat (moderate-intensity exercise).  Do strengthening exercises at least twice a week. This is in addition to the moderate-intensity exercise.  Spend less time sitting. Even light physical activity can be beneficial. Watch cholesterol and blood lipids Have your blood tested for lipids and cholesterol at 33 years of age, then have this test every 5 years. Have your cholesterol levels checked more often if:  Your lipid or cholesterol levels are high.  You are older than 33 years of age.  You are at high risk for heart disease. What should I know about cancer screening? Depending on your health history and family history, you may need to have cancer screening at various ages. This may include screening for:  Breast cancer.  Cervical cancer.  Colorectal cancer.  Skin cancer.  Lung cancer. What should I know about heart disease, diabetes, and high blood  pressure? Blood pressure and heart disease  High blood pressure causes heart disease and increases the risk of stroke. This is more likely to develop in people who have high blood pressure readings, are of African descent, or are overweight.  Have your blood pressure checked: ? Every 3-5 years if you are 68-56 years of age. ? Every year if you are 44 years old or older. Diabetes Have regular diabetes screenings. This checks your fasting blood sugar level. Have the screening done:  Once every three years after age 35 if you are at a normal weight and have a low risk for diabetes.  More often and at a younger age if you are overweight or have a high risk for diabetes. What should I know about preventing infection? Hepatitis B If you have a higher risk for hepatitis B, you should be screened for this virus. Talk with your health care provider to find out if you are at risk for hepatitis B infection. Hepatitis C Testing is recommended for:  Everyone born from [birth year] through 1964.  Anyone with known risk factors for hepatitis C. Sexually transmitted infections (STIs)  Get screened for STIs, including gonorrhea and chlamydia, if: ? You are sexually active and are younger than 33 years of age. ? You are older than 33 years of age and your health care provider tells you that you are at risk for this type of infection. ? Your sexual activity has changed since you were last screened, and you are at increased risk for chlamydia or gonorrhea. Ask your health care provider if  you are at risk.  Ask your health care provider about whether you are at high risk for HIV. Your health care provider may recommend a prescription medicine to help prevent HIV infection. If you choose to take medicine to prevent HIV, you should first get tested for HIV. You should then be tested every 3 months for as long as you are taking the medicine. Pregnancy  If you are about to stop having your period (premenopausal) and  you may become pregnant, seek counseling before you get pregnant.  Take 400 to 800 micrograms (mcg) of folic acid every day if you become pregnant.  Ask for birth control (contraception) if you want to prevent pregnancy. Osteoporosis and menopause Osteoporosis is a disease in which the bones lose minerals and strength with aging. This can result in bone fractures. If you are 49 years old or older, or if you are at risk for osteoporosis and fractures, ask your health care provider if you should:  Be screened for bone loss.  Take a calcium or vitamin D supplement to lower your risk of fractures.  Be given hormone replacement therapy (HRT) to treat symptoms of menopause. Follow these instructions at home: Lifestyle  Do not use any products that contain nicotine or tobacco, such as cigarettes, e-cigarettes, and chewing tobacco. If you need help quitting, ask your health care provider.  Do not use street drugs.  Do not share needles.  Ask your health care provider for help if you need support or information about quitting drugs. Alcohol use  Do not drink alcohol if: ? Your health care provider tells you not to drink. ? You are pregnant, may be pregnant, or are planning to become pregnant.  If you drink alcohol: ? Limit how much you use to 0-1 drink a day. ? Limit intake if you are breastfeeding.  Be aware of how much alcohol is in your drink. In the [place] one drink equals one 12 oz bottle of beer (355 mL), one 5 oz glass of wine (148 mL), or one 1 oz glass of hard liquor (44 mL). General instructions  Schedule regular health, dental, and eye exams.  Stay current with your vaccines.  Tell your health care provider if: ? You often feel depressed. ? You have ever been abused or do not feel safe at home. Summary  Adopting a healthy lifestyle and getting preventive care are important in promoting health and wellness.  Follow your health care provider's instructions about healthy  diet, exercising, and getting tested or screened for diseases.  Follow your health care provider's instructions on monitoring your cholesterol and blood pressure. This information is not intended to replace advice given to you by your health care provider. Make sure you discuss any questions you have with your health care provider. Document Revised: 10/07/2018 Document Reviewed: 10/07/2018 Elsevier Patient Education  2020 [employer]   Sleep Apnea Sleep apnea affects breathing during sleep. It causes breathing to stop for a short time or to become shallow. It can also increase the risk of:  Heart attack.  Stroke.  Being very overweight (obese).  Diabetes.  Heart failure.  Irregular heartbeat. The goal of treatment is to help you breathe normally again. What are the causes? There are three kinds of sleep apnea:  Obstructive sleep apnea. This is caused by a blocked or collapsed airway.  Central sleep apnea. This happens when the brain does not send the right signals to the muscles that control breathing.  Mixed sleep apnea. This  is a combination of obstructive and central sleep apnea. The most common cause of this condition is a collapsed or blocked airway. This can happen if:  Your throat muscles are too relaxed.  Your tongue and tonsils are too large.  You are overweight.  Your airway is too small. What increases the risk?  Being overweight.  Smoking.  Having a small airway.  Being older.  Being female.  Drinking alcohol.  Taking medicines to calm yourself (sedatives or tranquilizers).  Having family members with the condition. What are the signs or symptoms?  Trouble staying asleep.  Being sleepy or tired during the day.  Getting angry a lot.  Loud snoring.  Headaches in the morning.  Not being able to focus your mind (concentrate).  Forgetting things.  Less interest in sex.  Mood swings.  Personality changes.  Feelings of sadness  (depression).  Waking up a lot during the night to pee (urinate).  Dry mouth.  Sore throat. How is this diagnosed?  Your medical history.  A physical exam.  A test that is done when you are sleeping (sleep study). The test is most often done in a sleep lab but may also be done at home. How is this treated?   Sleeping on your side.  Using a medicine to get rid of mucus in your nose (decongestant).  Avoiding the use of alcohol, medicines to help you relax, or certain pain medicines (narcotics).  Losing weight, if needed.  Changing your diet.  Not smoking.  Using a machine to open your airway while you sleep, such as: ? An oral appliance. This is a mouthpiece that shifts your lower jaw forward. ? A CPAP device. This device blows air through a mask when you breathe out (exhale). ? An EPAP device. This has valves that you put in each nostril. ? A BPAP device. This device blows air through a mask when you breathe in (inhale) and breathe out.  Having surgery if other treatments do not work. It is important to get treatment for sleep apnea. Without treatment, it can lead to:  High blood pressure.  Coronary artery disease.  In men, not being able to have an erection (impotence).  Reduced thinking ability. Follow these instructions at home: Lifestyle  Make changes that your doctor recommends.  Eat a healthy diet.  Lose weight if needed.  Avoid alcohol, medicines to help you relax, and some pain medicines.  Do not use any products that contain nicotine or tobacco, such as cigarettes, e-cigarettes, and chewing tobacco. If you need help quitting, ask your doctor. General instructions  Take over-the-counter and prescription medicines only as told by your doctor.  If you were given a machine to use while you sleep, use it only as told by your doctor.  If you are having surgery, make sure to tell your doctor you have sleep apnea. You may need to bring your device with  you.  Keep all follow-up visits as told by your doctor. This is important. Contact a doctor if:  The machine that you were given to use during sleep bothers you or does not seem to be working.  You do not get better.  You get worse. Get help right away if:  Your chest hurts.  You have trouble breathing in enough air.  You have an uncomfortable feeling in your back, arms, or stomach.  You have trouble talking.  One side of your body feels weak.  A part of your face is hanging down.  These symptoms may be an emergency. Do not wait to see if the symptoms will go away. Get medical help right away. Call your local emergency services (911 in the [place] Do not drive yourself to the hospital. Summary  This condition affects breathing during sleep.  The most common cause is a collapsed or blocked airway.  The goal of treatment is to help you breathe normally while you sleep. This information is not intended to replace advice given to you by your health care provider. Make sure you discuss any questions you have with your health care provider. Document Revised: 07/31/2018 Document Reviewed: 06/09/2018 Elsevier Patient Education  2020 [employer].

## 2020-11-03 ENCOUNTER — Ambulatory Visit: Payer: Medicaid Other

## 2020-12-08 ENCOUNTER — Other Ambulatory Visit: Payer: Self-pay

## 2020-12-08 ENCOUNTER — Ambulatory Visit: Payer: Medicaid Other | Attending: Nurse Practitioner | Admitting: Speech Pathology

## 2020-12-08 ENCOUNTER — Encounter: Payer: Self-pay | Admitting: Speech Pathology

## 2020-12-08 DIAGNOSIS — R41841 Cognitive communication deficit: Secondary | ICD-10-CM | POA: Diagnosis not present

## 2020-12-08 NOTE — Patient Instructions (Signed)
Patient will receive a phone call from our office to schedule for treatment.

## 2020-12-08 NOTE — Therapy (Signed)
[facility] [address]. [place], [place], [address] Phone: [phone]   Fax:  [phone]  Speech Language Pathology Evaluation  Patient Details  Name: Nichole Edwards MRN: [id number] Date of Birth: [birth year] No data recorded  Encounter Date: 12/08/2020   End of Session - 12/08/20 1053    Visit Number 1    Number of Visits 8    Date for SLP Re-Evaluation 02/05/21    SLP Start Time 1004    SLP Stop Time  1049    SLP Time Calculation (min) 45 min    Activity Tolerance Patient tolerated treatment well           Past Medical History:  Diagnosis Date  . Gestational diabetes   . Medical history non-contributory     Past Surgical History:  Procedure Laterality Date  . CHOLECYSTECTOMY      There were no vitals filed for this visit.   Subjective Assessment - 12/08/20 1020    Subjective "I forgot my son's name and it took me 30 seconds to  remember."    Currently in Pain? No/denies              SLP Evaluation [facility] - 12/08/20 1020      Subjective   Patient/Family Stated Goal I want to remember where I put things, be able to focus more/process information better.      Balance Screen   Has the patient fallen in the past 6 months No    Has the patient had a decrease in activity level because of a fear of falling?  No    Is the patient reluctant to leave their home because of a fear of falling?  No      Prior Functional Status   Cognitive/Linguistic Baseline Within functional limits    Type of Home House     Lives With Son;Significant other    Available Support Friend(s);Neighbor    Vocation Full time employment      Cognition   Overall Cognitive Status Impaired/Different from baseline    Area of Impairment Memory;Attention      Standardized Assessments   Standardized Assessments  Other Assessment   SLUMS; Organization and Decision Making task; PRO Form NEURO-QOL           SLU Mental Status (SLUMS  Examination)  Orientation: 3/3 Delayed Recall w/ Interference: 4/5 Numeric Calculation and Registration: 1/3 Immediate Recall w/ Interference (Generative naming): 3/3 Registration and Digit Span: 2/2 Visual Spatial/Exec Functioning: 6/6 Executive Functioning/Extrapolation: 8 /8  SLUMS 27/30    NOMS Admission Patient-Reported Outcome (PRO) Form NEURO-QOL Cognitive Function   In the past 7 days.... (Never, Rarely, Sometimes, Often, Very Often)  "Very Often"  I had to read something several times to understand. I had to work really hard to pay attention or I would make a mistake. I had trouble concentrating.    "Often" My thinking was slow.   How much DIFFICULTY do you currently have... (None, Little, Somewhat, A lot, Cannot do)  "Somewhat"  Reading and following complex instructions. Learning new tasks or instructions.  "A little"  Managing your time to do most of your daily activities.  "None"  Planning for or keeping appointments that are not a part of your weekly routine.              SLP Education - 12/08/20 1052    Education Details Provided edu on cognition and memory.    Person(s) Educated Patient  Methods Explanation    Comprehension Need further instruction;Verbalized understanding            SLP Short Term Goals - 12/08/20 1105      SLP SHORT TERM GOAL #1   Title Pt will recall 3 strategies to assist with recall of verbally presented information.    Time 4    Period Weeks    Status New      SLP SHORT TERM GOAL #2   Title Patient will demonstrate use of strategies with alternating attention given <2 verbal cues.    Time 4    Period Weeks    Status New            SLP Long Term Goals - 12/08/20 1107      SLP LONG TERM GOAL #1   Title Title Patient will develop functional attention skills to effectively attend to and communicate in tasks of daily living in their functional living environment.    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 Pt became emotional when describing her difficulty to SLP regarding new onset cognitive deficits. Pt reports she contracted covid in January 2021 and has had increasing difficulty with basic cognitive skills. Pt reports, "brain fog, difficulty putting a thought together, difficulty paying attention, and requiring extra time processing". She provided an example where she picked her son up from school, and couldn't think of his name for 30 sec to a minute. 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 SLP provided patient with a SLUMS where she required some repetition and extra time to complete (27/30) . She also completed an organization/decision making task with no difficulty. SLP administered NOMS Admission Patient-Reported Outcome Form (NEURO-QOL Cognitive Function). Patient scored herself at "Very Often" for difficulty with 6/8 cognitive tasks and "Often" for the other two prompts. After speaking with patient, these symptoms are significantly impacting her quality of life. SLP rec skilled speech services to address restorative and compensatory therapies to increase patient participation in ADLs.    Speech Therapy Frequency 1x /week    Duration 8 weeks    Treatment/Interventions Compensatory strategies;Cueing hierarchy;Patient/family education;Functional tasks;Cognitive reorganization;Compensatory techniques;Internal/external aids;SLP instruction and feedback;Environmental controls    Potential to Achieve Goals Good    SLP Home Exercise  Plan Will provide next session.    Consulted and Agree with Plan of Care Patient           Patient will benefit from skilled therapeutic intervention in order to improve the following deficits and impairments:   Cognitive communication deficit    Problem List Patient Active Problem List   Diagnosis Date Noted  . Elevated random blood glucose level 10/05/2020  . Chest tightness 10/05/2020  . COVID-19 long hauler manifesting chronic concentration deficit 10/05/2020  . History of COVID-19 10/05/2020  . Chronic nonintractable headache 10/05/2020  . Snoring 10/05/2020  . IUD check up 06/01/2019  . History of  gestational diabetes 05/04/2019  . Morbid obesity (HCC) 11/18/2018    Dorena Bodo, MS CCC-SLP/CBIS 12/08/2020, 11:17 AM  [facility] [address]. [place], [place], [address] Phone: [phone]   Fax:  [phone]  Name: Nichole Edwards MRN: [id number] Date of Birth: [birth year]

## 2020-12-27 ENCOUNTER — Encounter: Payer: Self-pay | Admitting: Diagnostic Neuroimaging

## 2020-12-27 ENCOUNTER — Ambulatory Visit: Payer: Medicaid Other | Admitting: Diagnostic Neuroimaging

## 2020-12-27 VITALS — BP 114/82 | HR 77 | Ht 63.0 in | Wt 255.0 lb

## 2020-12-27 DIAGNOSIS — U099 Post covid-19 condition, unspecified: Secondary | ICD-10-CM

## 2020-12-27 NOTE — Patient Instructions (Signed)
POST-COVID SYNDROME (brain fog, decr taste / smell, headaches) - daily physical activity / exercise (at least 15-30 minutes / day) - eat more plants / vegetables - increase social activities, brain stimulation, games, puzzles, hobbies, crafts, arts, music - aim for at least 7-8 hours sleep per night (or more) - avoid smoking and alcohol - may use small amounts of caffeine for focus (1 cup coffee / tea per day) - may consider migraine treatments in future

## 2020-12-27 NOTE — Progress Notes (Signed)
[facility]  PATIENT: Nichole Edwards DOB: October 06, 1986  REFERRING CLINICIAN: Ivonne Andrew, NP HISTORY FROM: patient  REASON FOR VISIT: new consult    HISTORICAL  CHIEF COMPLAINT:  Chief Complaint  Patient presents with  . Headache    Rm 6 New Pt  "Covid 11/18/19, still have brain fog and loss of taste/smell; headaches are now more severe, generalized, average 2-3 a week- almost nauseating; OTC Advil is most helpful"    HISTORY OF PRESENT ILLNESS:   35 year old female here for evaluation of post Covid symptoms.  Patient had Covid in January 2021, and since then has had loss of taste and smell, brain fog and headaches.  Having about 2-3 headaches a week with nausea.  Previously had some headaches also with nausea but less frequent.  Sleeps 7 to 8 hours per night.  Has good nutrition.  Has not been that physically active since Covid. Symptoms stable to slightly improving.    REVIEW OF SYSTEMS: Full 14 system review of systems performed and negative with exception of: as per HPI.   ALLERGIES: No Known Allergies  HOME MEDICATIONS: Outpatient Medications Prior to Visit  Medication Sig Dispense Refill  . fluticasone (FLONASE) 50 MCG/ACT nasal spray Place 1 spray into both nostrils daily. 16 g 2  . omeprazole (PRILOSEC) 20 MG capsule Take 1 capsule (20 mg total) by mouth daily. 30 capsule 3   No facility-administered medications prior to visit.    PAST MEDICAL HISTORY: Past Medical History:  Diagnosis Date  . COVID 10/2019  . Gestational diabetes   . Headache   . Medical history non-contributory     PAST SURGICAL HISTORY: Past Surgical History:  Procedure Laterality Date  . CHOLECYSTECTOMY  2014    FAMILY HISTORY: Family History  Problem Relation Age of Onset  . Parkinson's disease Father   . Brain cancer Maternal Grandmother   . Parkinson's disease Maternal Grandfather   . Stroke Paternal Grandmother   . Alzheimer's disease Paternal  Grandmother     SOCIAL HISTORY: Social History   Socioeconomic History  . Marital status: Single    Spouse name: Not on file  . Number of children: 2  . Years of education: Not on file  . Highest education level: Some college, no degree  Occupational History    Comment: case Production designer, theatre/television/film for non profit, work from home  Tobacco Use  . Smoking status: Never Smoker  . Smokeless tobacco: Never Used  Vaping Use  . Vaping Use: Never used  Substance and Sexual Activity  . Alcohol use: Not Currently  . Drug use: Never  . Sexual activity: Yes    Partners: Male    Birth control/protection: I.U.D.  Other Topics Concern  . Not on file  Social History Narrative   Caffeine- soda 1 a day   Social Determinants of Health   Financial Resource Strain: Not on file  Food Insecurity: Not on file  Transportation Needs: Not on file  Physical Activity: Not on file  Stress: Not on file  Social Connections: Not on file  Intimate Partner Violence: Not on file     PHYSICAL EXAM  GENERAL EXAM/CONSTITUTIONAL: Vitals:  Vitals:   12/27/20 0843  BP: 114/82  Pulse: 77  Weight: 255 lb (115.7 kg)  Height: 5\' 3"  (1.6 m)     Body mass index is 45.17 kg/m. Wt Readings from Last 3 Encounters:  12/27/20 255 lb (115.7 kg)  10/18/20 256 lb (116.1 kg)  10/05/20 257 lb (116.6 kg)  Patient is in no distress; well developed, nourished and groomed; neck is supple  CARDIOVASCULAR:  Examination of carotid arteries is normal; no carotid bruits  Regular rate and rhythm, no murmurs  Examination of peripheral vascular system by observation and palpation is normal  EYES:  Ophthalmoscopic exam of optic discs and posterior segments is normal; no papilledema or hemorrhages  No exam data present  MUSCULOSKELETAL:  Gait, strength, tone, movements noted in Neurologic exam below  NEUROLOGIC: MENTAL STATUS:  No flowsheet data found.  awake, alert, oriented to person, place and time  recent  and remote memory intact  normal attention and concentration  language fluent, comprehension intact, naming intact  fund of knowledge appropriate  CRANIAL NERVE:   2nd - no papilledema on fundoscopic exam  2nd, 3rd, 4th, 6th - pupils equal and reactive to light, visual fields full to confrontation, extraocular muscles intact, no nystagmus  5th - facial sensation symmetric  7th - facial strength symmetric  8th - hearing intact  9th - palate elevates symmetrically, uvula midline  11th - shoulder shrug symmetric  12th - tongue protrusion midline  MOTOR:   normal bulk and tone, full strength in the BUE, BLE  SENSORY:   normal and symmetric to light touch, temperature, vibration  COORDINATION:   finger-nose-finger, fine finger movements normal  REFLEXES:   deep tendon reflexes present and symmetric  GAIT/STATION:   narrow based gait     DIAGNOSTIC DATA (LABS, IMAGING, TESTING) - I reviewed patient records, labs, notes, testing and imaging myself where available.  Lab Results  Component Value Date   WBC 12.7 (H) 10/05/2020   HGB 13.0 10/05/2020   HCT 38.1 10/05/2020   MCV 90 10/05/2020   PLT 304 10/05/2020      Component Value Date/Time   NA 140 10/05/2020 1101   K 4.6 10/05/2020 1101   CL 106 10/05/2020 1101   CO2 23 10/05/2020 1101   GLUCOSE 97 10/05/2020 1101   GLUCOSE 181 (H) 06/14/2020 1341   BUN 10 10/05/2020 1101   CREATININE 0.81 10/05/2020 1101   CALCIUM 9.3 10/05/2020 1101   PROT 6.1 10/05/2020 1101   ALBUMIN 4.0 10/05/2020 1101   AST 18 10/05/2020 1101   ALT 23 10/05/2020 1101   ALKPHOS 64 10/05/2020 1101   BILITOT <0.2 10/05/2020 1101   GFRNONAA 96 10/05/2020 1101   GFRAA 110 10/05/2020 1101   No results found for: CHOL, HDL, LDLCALC, LDLDIRECT, TRIG, CHOLHDL Lab Results  Component Value Date   HGBA1C 5.6 10/18/2020   No results found for: [id number] Lab Results  Component Value Date   TSH 4.350 10/05/2020        ASSESSMENT AND PLAN  35 y.o. year old female here with:  Dx:  1. Post-COVID syndrome     PLAN:  POST-COVID SYNDROME (brain fog, decr taste / smell, headaches) - daily physical activity / exercise (at least 15-30 minutes / day) - eat more plants / vegetables - increase social activities, brain stimulation, games, puzzles, hobbies, crafts, arts, music - aim for at least 7-8 hours sleep per night (or more) - avoid smoking and alcohol - may use small amounts of caffeine for focus (1 cup coffee / tea per day) - may consider migraine treatments in future  Return for return to PCP, pending if symptoms worsen or fail to improve.    Suanne Marker, MD 12/27/2020, 9:32 AM Certified in Neurology, Neurophysiology and Neuroimaging  [facility] [address] [place], [place]  [address] [phone]  

## 2020-12-29 ENCOUNTER — Ambulatory Visit: Payer: Medicaid Other | Admitting: Speech Pathology

## 2021-01-05 ENCOUNTER — Ambulatory Visit: Payer: Medicaid Other | Attending: Nurse Practitioner | Admitting: Speech Pathology

## 2021-01-05 ENCOUNTER — Encounter: Payer: Self-pay | Admitting: Speech Pathology

## 2021-01-05 ENCOUNTER — Other Ambulatory Visit: Payer: Self-pay

## 2021-01-05 DIAGNOSIS — R41841 Cognitive communication deficit: Secondary | ICD-10-CM | POA: Diagnosis not present

## 2021-01-05 NOTE — Therapy (Signed)
[facility] [address] [place], [place], [address] Phone: [phone]   Fax:  [phone]  Speech Language Pathology Treatment  Patient Details  Name: Nichole Edwards MRN: [id number] Date of Birth: 06/06/1986 No data recorded  Encounter Date: 01/05/2021   End of Session - 01/05/21 1129    Visit Number 2    Number of Visits 8    Date for SLP Re-Evaluation 02/05/21    SLP Start Time 1100    SLP Stop Time  1145    SLP Time Calculation (min) 45 min    Activity Tolerance Patient tolerated treatment well           Past Medical History:  Diagnosis Date  . COVID 10/2019  . Gestational diabetes   . Headache   . Medical history non-contributory     Past Surgical History:  Procedure Laterality Date  . CHOLECYSTECTOMY  2014    There were no vitals filed for this visit.          ADULT SLP TREATMENT - 01/05/21 1132      General Information   Behavior/Cognition Alert;Pleasant mood;Cooperative      Treatment Provided   Treatment provided Cognitive-Linquistic      Cognitive-Linquistic Treatment   Treatment focused on Cognition    Skilled Treatment Treatment focused on attention strategies to use for trouble at home switching between tasks and feeling overwhelmed with additional information. Other memory strategies were also discussed.      Assessment / Recommendations / Plan   Plan Continue with current plan of care      Progression Toward Goals   Progression toward goals Progressing toward goals              SLP Short Term Goals - 01/05/21 1130      SLP SHORT TERM GOAL #1   Title Pt will recall 3 strategies to assist with recall of verbally presented information.    Time 3    Period Weeks    Status New      SLP SHORT TERM GOAL #2   Title Patient will demonstrate use of strategies with alternating attention given <2 verbal cues.    Time 3    Period Weeks    Status New            SLP Long Term  Goals - 01/05/21 1131      SLP LONG TERM GOAL #1   Title Title Patient will develop functional attention skills to effectively attend to and communicate in tasks of daily living in their functional living environment.    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 Pt became emotional when describing her difficulty to SLP regarding new onset cognitive deficits. Pt reports she contracted covid in January 2021 and has had increasing difficulty with basic cognitive skills. Pt reports, "brain fog, difficulty putting a thought together, difficulty paying attention, and requiring extra time processing". She provided  an example where she picked her son up from school, and couldn't think of his name for 30 sec to a minute. 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 SLP provided patient with a SLUMS where she required some repetition and extra time to complete (27/30) . She also completed an organization/decision making task with no difficulty. SLP administered NOMS Admission Patient-Reported Outcome Form (NEURO-QOL Cognitive Function). Patient scored herself at "Very Often" for difficulty with 6/8 cognitive tasks and "Often" for the other two prompts. After speaking with patient, these symptoms are significantly impacting her quality of life. SLP rec skilled speech services to address restorative and compensatory therapies to increase patient participation in ADLs.    Speech Therapy Frequency 1x /week    Duration 8 weeks    Treatment/Interventions  Compensatory strategies;Cueing hierarchy;Patient/family education;Functional tasks;Cognitive reorganization;Compensatory techniques;Internal/external aids;SLP instruction and feedback;Environmental controls    Potential to Achieve Goals Good    SLP Home Exercise Plan Will provide next session.    Consulted and Agree with Plan of Care Patient           Patient will benefit from skilled therapeutic intervention in order to improve the following deficits and impairments:   Cognitive communication deficit    Problem List Patient Active Problem List   Diagnosis Date Noted  . Elevated random blood glucose level 10/05/2020  . Chest tightness 10/05/2020  . COVID-19 long hauler manifesting chronic concentration deficit 10/05/2020  . History of COVID-19 10/05/2020  . Chronic nonintractable headache 10/05/2020  . Snoring 10/05/2020  . IUD check up 06/01/2019  . History of gestational diabetes 05/04/2019  . Morbid obesity (HCC) 11/18/2018    Michelene Gardener [place] MS, [place], CBIS  01/05/2021, 11:45 AM  [facility] [address] [place], [place], [address] Phone: [phone]   Fax:  [phone]   Name: Nichole Edwards MRN: [id number] Date of Birth: [birth year]

## 2021-01-05 NOTE — Patient Instructions (Signed)
Pick two attention strategies that you will focus on.

## 2021-01-08 ENCOUNTER — Ambulatory Visit: Payer: Medicaid Other | Admitting: Speech Pathology

## 2021-01-09 ENCOUNTER — Ambulatory Visit: Payer: Medicaid Other | Admitting: Speech Pathology

## 2021-01-09 ENCOUNTER — Other Ambulatory Visit: Payer: Self-pay

## 2021-01-09 DIAGNOSIS — R41841 Cognitive communication deficit: Secondary | ICD-10-CM

## 2021-01-09 NOTE — Therapy (Signed)
[facility] [address] [place], [place], [address] Phone: [phone]   Fax:  [phone]  Speech Language Pathology Treatment  Patient Details  Name: Nichole Edwards MRN: [id number] Date of Birth: [birth year] No data recorded  Encounter Date: 01/09/2021   End of Session - 01/09/21 1451    Visit Number 3    Number of Visits 8    Date for SLP Re-Evaluation 02/05/21    SLP Start Time 1445    SLP Stop Time  1530    SLP Time Calculation (min) 45 min    Activity Tolerance Patient tolerated treatment well           Past Medical History:  Diagnosis Date  . COVID 10/2019  . Gestational diabetes   . Headache   . Medical history non-contributory     Past Surgical History:  Procedure Laterality Date  . CHOLECYSTECTOMY  2014    There were no vitals filed for this visit.          ADULT SLP TREATMENT - 01/09/21 1505      General Information   Behavior/Cognition Alert;Pleasant mood;Cooperative      Treatment Provided   Treatment provided Cognitive-Linquistic      Cognitive-Linquistic Treatment   Treatment focused on Cognition    Skilled Treatment Treatment focused on internal memory strategies. Pt reports she is implementing external memory strategies at home and will focus on internal memory strategies. WRAP strategies also introduced.Medication management assessed - pt was able to organize pills with minA. Pt was not paying attention to "time of day" when completing.      Assessment / Recommendations / Plan   Plan Continue with current plan of care      Progression Toward Goals   Progression toward goals Progressing toward goals              SLP Short Term Goals - 01/09/21 1503      SLP SHORT TERM GOAL #1   Title Pt will recall 3 strategies to assist with recall of verbally presented information.    Time 2    Period Weeks    Status New      SLP SHORT TERM GOAL #2   Title Patient will demonstrate use  of strategies with alternating attention given <2 verbal cues.    Time 2    Period Weeks    Status New            SLP Long Term Goals - 01/09/21 1504      SLP LONG TERM GOAL #1   Title Title Patient will develop functional attention skills to effectively attend to and communicate in tasks of daily living in their functional living environment.    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 Pt became emotional when describing her difficulty to SLP regarding new onset cognitive deficits. Pt reports she contracted covid in January 2021 and has had increasing difficulty  with basic cognitive skills. Pt reports, "brain fog, difficulty putting a thought together, difficulty paying attention, and requiring extra time processing". She provided an example where she picked her son up from school, and couldn't think of his name for 30 sec to a minute. 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 SLP provided patient with a SLUMS where she required some repetition and extra time to complete (27/30) . She also completed an organization/decision making task with no difficulty. SLP administered NOMS Admission Patient-Reported Outcome Form (NEURO-QOL Cognitive Function). Patient scored herself at "Very Often" for difficulty with 6/8 cognitive tasks and "Often" for the other two prompts. After speaking with patient, these symptoms are significantly impacting her quality of life. SLP rec skilled speech services to address restorative and compensatory therapies to  increase patient participation in ADLs.    Speech Therapy Frequency 1x /week    Duration 8 weeks    Treatment/Interventions Compensatory strategies;Cueing hierarchy;Patient/family education;Functional tasks;Cognitive reorganization;Compensatory techniques;Internal/external aids;SLP instruction and feedback;Environmental controls    Potential to Achieve Goals Good    SLP Home Exercise Plan Will provide next session.    Consulted and Agree with Plan of Care Patient           Patient will benefit from skilled therapeutic intervention in order to improve the following deficits and impairments:   Cognitive communication deficit    Problem List Patient Active Problem List   Diagnosis Date Noted  . Elevated random blood glucose level 10/05/2020  . Chest tightness 10/05/2020  . COVID-19 long hauler manifesting chronic concentration deficit 10/05/2020  . History of COVID-19 10/05/2020  . Chronic nonintractable headache 10/05/2020  . Snoring 10/05/2020  . IUD check up 06/01/2019  . History of gestational diabetes 05/04/2019  . Morbid obesity (HCC) 11/18/2018    Michelene Gardener [place] MS, [place], CBIS  01/09/2021, 3:28 PM  [facility] [address] [place], [place], [address] Phone: [phone]   Fax:  [phone]   Name: Nichole Edwards MRN: [id number] Date of Birth: 1986/06/27

## 2021-01-19 ENCOUNTER — Other Ambulatory Visit: Payer: Self-pay

## 2021-01-19 ENCOUNTER — Encounter: Payer: Self-pay | Admitting: Speech Pathology

## 2021-01-19 ENCOUNTER — Ambulatory Visit: Payer: Medicaid Other | Admitting: Speech Pathology

## 2021-01-19 DIAGNOSIS — R41841 Cognitive communication deficit: Secondary | ICD-10-CM

## 2021-01-19 NOTE — Therapy (Signed)
[facility] [address] [place], [place], [address] Phone: [phone]   Fax:  [phone]  Speech Language Pathology Treatment  Patient Details  Name: Nichole Edwards MRN: [id number] Date of Birth: [birth year] No data recorded  Encounter Date: 01/19/2021   End of Session - 01/19/21 1111    Visit Number 4    Number of Visits 8    Date for SLP Re-Evaluation 02/05/21    SLP Start Time 1107    SLP Stop Time  1145    SLP Time Calculation (min) 38 min    Activity Tolerance Patient tolerated treatment well           Past Medical History:  Diagnosis Date  . COVID 10/2019  . Gestational diabetes   . Headache   . Medical history non-contributory     Past Surgical History:  Procedure Laterality Date  . CHOLECYSTECTOMY  2014    There were no vitals filed for this visit.   Subjective Assessment - 01/19/21 1110    Subjective "I feel like things have gotten better."    Currently in Pain? No/denies                 ADULT SLP TREATMENT - 01/19/21 1135      General Information   Behavior/Cognition Alert;Pleasant mood;Cooperative      Treatment Provided   Treatment provided Cognitive-Linquistic      Cognitive-Linquistic Treatment   Treatment focused on Cognition    Skilled Treatment Treatment focused on organization and working memory strategies. Pt was able to provide examples independently. Pt reports she is using strategies at home and seeing progress. Continue with current plan of care and reassess next session.      Assessment / Recommendations / Plan   Plan Continue with current plan of care      Progression Toward Goals   Progression toward goals Progressing toward goals              SLP Short Term Goals - 01/19/21 1113      SLP SHORT TERM GOAL #1   Title Pt will recall 3 strategies to assist with recall of verbally presented information.    Time 1    Period Weeks    Status New      SLP SHORT  TERM GOAL #2   Title Patient will demonstrate use of strategies with alternating attention given <2 verbal cues.    Time 1    Period Weeks    Status New            SLP Long Term Goals - 01/19/21 1126      SLP LONG TERM GOAL #1   Title Title Patient will develop functional attention skills to effectively attend to and communicate in tasks of daily living in their functional living environment.    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 Pt became emotional when describing her difficulty to  SLP regarding new onset cognitive deficits. Pt reports she contracted covid in January 2021 and has had increasing difficulty with basic cognitive skills. Pt reports, "brain fog, difficulty putting a thought together, difficulty paying attention, and requiring extra time processing". She provided an example where she picked her son up from school, and couldn't think of his name for 30 sec to a minute. 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 SLP provided patient with a SLUMS where she required some repetition and extra time to complete (27/30) . She also completed an organization/decision making task with no difficulty. SLP administered NOMS Admission Patient-Reported Outcome Form (NEURO-QOL Cognitive Function). Patient scored herself at "Very Often" for difficulty with 6/8 cognitive tasks and "Often" for the other two prompts. After speaking with patient, these symptoms are significantly impacting her quality of life. SLP rec skilled speech services to  address restorative and compensatory therapies to increase patient participation in ADLs.    Speech Therapy Frequency 1x /week    Duration 8 weeks    Treatment/Interventions Compensatory strategies;Cueing hierarchy;Patient/family education;Functional tasks;Cognitive reorganization;Compensatory techniques;Internal/external aids;SLP instruction and feedback;Environmental controls    Potential to Achieve Goals Good    SLP Home Exercise Plan Will provide next session.    Consulted and Agree with Plan of Care Patient           Patient will benefit from skilled therapeutic intervention in order to improve the following deficits and impairments:   Cognitive communication deficit    Problem List Patient Active Problem List   Diagnosis Date Noted  . Elevated random blood glucose level 10/05/2020  . Chest tightness 10/05/2020  . COVID-19 long hauler manifesting chronic concentration deficit 10/05/2020  . History of COVID-19 10/05/2020  . Chronic nonintractable headache 10/05/2020  . Snoring 10/05/2020  . IUD check up 06/01/2019  . History of gestational diabetes 05/04/2019  . Morbid obesity (HCC) 11/18/2018    Michelene Gardener [place] MS, [place], CBIS  01/19/2021, 11:43 AM  [facility] [address] [place], [place], [address] Phone: [phone]   Fax:  [phone]   Name: Nichole Edwards MRN: [id number] Date of Birth: [birth year]

## 2021-01-26 ENCOUNTER — Ambulatory Visit: Payer: BC Managed Care – PPO | Admitting: Speech Pathology

## 2021-01-29 ENCOUNTER — Encounter: Payer: Self-pay | Admitting: Speech Pathology

## 2021-01-29 ENCOUNTER — Other Ambulatory Visit: Payer: Self-pay

## 2021-01-29 ENCOUNTER — Ambulatory Visit: Payer: BC Managed Care – PPO | Attending: Nurse Practitioner | Admitting: Speech Pathology

## 2021-01-29 DIAGNOSIS — R41841 Cognitive communication deficit: Secondary | ICD-10-CM | POA: Insufficient documentation

## 2021-01-29 NOTE — Therapy (Signed)
[place]. [place], [place], [address] Phone: [phone]   Fax:  [phone]  Speech Language Pathology Treatment & Discharge  Patient Details  Name: Nichole Edwards MRN: [id number] Date of Birth: [birth year] No data recorded  Encounter Date: 01/29/2021   End of Session - 01/29/21 1455    Visit Number 5    Number of Visits 8    Date for SLP Re-Evaluation 02/05/21    SLP Start Time 9201    SLP Stop Time  1505    SLP Time Calculation (min) 20 min    Activity Tolerance Patient tolerated treatment well           Past Medical History:  Diagnosis Date  . COVID 10/2019  . Gestational diabetes   . Headache   . Medical history non-contributory     Past Surgical History:  Procedure Laterality Date  . CHOLECYSTECTOMY  2014    There were no vitals filed for this visit.   Subjective Assessment - 01/29/21 1451    Subjective "I am feeling really good."    Currently in Pain? No/denies                 ADULT SLP TREATMENT - 01/29/21 1507      General Information   Behavior/Cognition Alert;Pleasant mood;Cooperative      Treatment Provided   Treatment provided Cognitive-Linquistic      Cognitive-Linquistic Treatment   Treatment focused on Patient/family/caregiver education    Skilled Treatment Pt reports significant improvement in her symptoms by using strategies taught by SLP. Pt reports she is in agreement with d/c from therapy. SLP reviewed all goals labeled "achieved" with patient.      Assessment / Recommendations / Plan   Plan All goals met      Progression Toward Goals   Progression toward goals Goals met, education completed, patient discharged from SLP            SLP Education - 01/29/21 1454    Education Details Provided edu on memory.    Person(s) Educated Patient    Methods Explanation    Comprehension Verbalized understanding            SLP Short Term Goals - 01/29/21 1456       SLP SHORT TERM GOAL #1   Title Pt will recall 3 strategies to assist with recall of verbally presented information.    Time 1    Period Weeks    Status Achieved      SLP SHORT TERM GOAL #2   Title Patient will demonstrate use of strategies with alternating attention given <2 verbal cues.    Time 1    Period Weeks    Status Achieved            SLP Long Term Goals - 01/29/21 1457      SLP LONG TERM GOAL #1   Title Title Patient will develop functional attention skills to effectively attend to and communicate in tasks of daily living in their functional living environment.    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 Pt to be d/c from therapy - pt in agreement.    Speech Therapy Frequency 1x /week    Duration 8 weeks    Treatment/Interventions Compensatory strategies;Cueing hierarchy;Patient/family education;Functional tasks;Cognitive reorganization;Compensatory techniques;Internal/external aids;SLP instruction and feedback;Environmental controls    Potential to Achieve Goals Good    SLP Home Exercise Plan Will provide next session.    Consulted and Agree with Plan of Care Patient           Patient will benefit from skilled therapeutic intervention in order to improve the following deficits and impairments:   Cognitive communication deficit    Problem List Patient Active Problem List   Diagnosis Date Noted  . Elevated random blood glucose level 10/05/2020  . Chest tightness 10/05/2020  . COVID-19 long hauler manifesting chronic concentration deficit  10/05/2020  . History of COVID-19 10/05/2020  . Chronic nonintractable headache 10/05/2020  . Snoring 10/05/2020  . IUD check up 06/01/2019  . History of gestational diabetes 05/04/2019  . Morbid obesity ([place]) 11/18/2018   SPEECH THERAPY DISCHARGE SUMMARY  Visits from Start of Care: 5  Current functional level related to goals / functional outcomes: [facility] for tasks assessed   Remaining deficits: Pt reports improvement in symptoms. Inconsistent brain fog; whereas, it was everyday. She reports she is better able to manage symptoms with strategies provided by SLP and no longer has any concerns.    Education / Equipment: Edu provided on attention, short term, and working Marine scientist.  Plan: Patient agrees to discharge.  Patient goals were met. Patient is being discharged due to meeting the stated rehab goals.  ?????        Jordan Early MS, [place], CBIS  01/29/2021, 3:10 PM  [place]. [place], [place], [address] Phone: [phone]   Fax:  [phone]   Name: Nichole Edwards MRN: [id number] Date of Birth: [birth year]

## 2021-02-03 ENCOUNTER — Encounter (HOSPITAL_COMMUNITY): Payer: Self-pay | Admitting: Emergency Medicine

## 2021-02-03 ENCOUNTER — Ambulatory Visit (HOSPITAL_COMMUNITY)
Admission: EM | Admit: 2021-02-03 | Discharge: 2021-02-03 | Disposition: A | Discharge status: Home / Self Care | Payer: BC Managed Care – PPO | Attending: Emergency Medicine | Admitting: Emergency Medicine

## 2021-02-03 ENCOUNTER — Other Ambulatory Visit: Payer: Self-pay

## 2021-02-03 DIAGNOSIS — L509 Urticaria, unspecified: Secondary | ICD-10-CM | POA: Diagnosis not present

## 2021-02-03 MED ORDER — METHYLPREDNISOLONE SODIUM SUCC 125 MG IJ SOLR
80.0000 mg | Freq: Once | INTRAMUSCULAR | Status: AC
  Administered 2021-02-03: 80 mg via INTRAMUSCULAR

## 2021-02-03 MED ORDER — METHYLPREDNISOLONE SODIUM SUCC 125 MG IJ SOLR
INTRAMUSCULAR | Status: AC
  Filled 2021-02-03: qty 2

## 2021-02-03 MED ORDER — PREDNISONE 10 MG (21) PO TBPK: ORAL_TABLET | Freq: Every day | ORAL | 0 refills | Status: AC

## 2021-02-03 NOTE — Discharge Instructions (Signed)
You were given an injection of a steroid called Solu-Medrol.  Start the prednisone taper tomorrow as directed.    Take Benadryl every 6 hours as directed; do not drive, operate machinery, or drink alcohol with this medication as it may cause drowsiness.  If you need to be awake and alert, take Claritin as directed.    Call 911 and go to the emergency department if you have difficulty swallowing or breathing.    Follow-up with your primary care provider next week.

## 2021-02-03 NOTE — ED Triage Notes (Addendum)
Pt presents today with c/o of rash. She reports waking up this morning with generalized rash. She denies any new meds or soaps. She used Flonase pta with no relief.

## 2021-02-03 NOTE — ED Provider Notes (Signed)
[facility]    CSN: [id number] Arrival date & time: 02/03/21  1129      History   Chief Complaint Chief Complaint  Patient presents with  . Rash    HPI Nichole Edwards is a 35 y.o. female.   Patient presents with hives since she woke up this morning.  No difficulty swallowing or breathing.  She denies new medications, products, foods; No known cause of the rash.  Treatment attempted at home with Flonase nasal spray.  Patient denies fever, chills, sore throat, cough, shortness of breath, abdominal pain, or other symptoms.  She reports having a similar rash last fall; she states she was seen in the ED at that time and told that it was due to acid reflux.  In reading the note from the ED visit on 06/14/2020, no mention is made of a rash; Patient had gastrointestinal symptoms and was diagnosed with gastroenteritis and chest wall pain.  Her medical history includes morbid obesity, gestational diabetes, COVID-19 long hauler manifesting chronic concentration deficit, chronic headache.  The history is provided by the patient and medical records.    Past Medical History:  Diagnosis Date  . COVID 10/2019  . Gestational diabetes   . Headache   . Medical history non-contributory     Patient Active Problem List   Diagnosis Date Noted  . Elevated random blood glucose level 10/05/2020  . Chest tightness 10/05/2020  . COVID-19 long hauler manifesting chronic concentration deficit 10/05/2020  . History of COVID-19 10/05/2020  . Chronic nonintractable headache 10/05/2020  . Snoring 10/05/2020  . IUD check up 06/01/2019  . History of gestational diabetes 05/04/2019  . Morbid obesity (HCC) 11/18/2018    Past Surgical History:  Procedure Laterality Date  . CHOLECYSTECTOMY  2014    OB History    Gravida  2   Para  2   Term  2   Preterm      AB      Living  2     SAB      IAB      Ectopic      Multiple  0   Live Births  2            Home Medications     Prior to Admission medications   Medication Sig Start Date End Date Taking? Authorizing Provider  fluticasone (FLONASE) 50 MCG/ACT nasal spray Place 1 spray into both nostrils daily. 08/11/19  Yes Burky, Dorene Grebe B, NP  predniSONE (STERAPRED UNI-PAK 21 TAB) 10 MG (21) TBPK tablet Take by mouth daily. As directed 02/04/21  Yes Mickie Bail, NP  omeprazole (PRILOSEC) 20 MG capsule Take 1 capsule (20 mg total) by mouth daily. 10/05/20   Ivonne Andrew, NP    Family History Family History  Problem Relation Age of Onset  . Parkinson's disease Father   . Brain cancer Maternal Grandmother   . Parkinson's disease Maternal Grandfather   . Stroke Paternal Grandmother   . Alzheimer's disease Paternal Grandmother     Social History Social History   Tobacco Use  . Smoking status: Never Smoker  . Smokeless tobacco: Never Used  Vaping Use  . Vaping Use: Never used  Substance Use Topics  . Alcohol use: Not Currently  . Drug use: Never     Allergies   Patient has no known allergies.   Review of Systems Review of Systems  Constitutional: Negative for chills and fever.  HENT: Negative for ear pain, sore throat, trouble  swallowing and voice change.   Eyes: Negative for pain and visual disturbance.  Respiratory: Negative for cough and shortness of breath.   Cardiovascular: Negative for chest pain and palpitations.  Gastrointestinal: Negative for abdominal pain and vomiting.  Genitourinary: Negative for dysuria and hematuria.  Musculoskeletal: Negative for arthralgias and back pain.  Skin: Positive for rash. Negative for color change.  Neurological: Negative for seizures and syncope.  All other systems reviewed and are negative.    Physical Exam Triage Vital Signs ED Triage Vitals  Enc Vitals Group     BP      Pulse      Resp      Temp      Temp src      SpO2      Weight      Height      Head Circumference      Peak Flow      Pain Score      Pain Loc      Pain Edu?       Excl. in GC?    No data found.  Updated Vital Signs BP 129/85 (BP Location: Right Arm)   Pulse 96   Temp 98.3 F (36.8 C) (Oral)   Resp 18   SpO2 98%   Visual Acuity Right Eye Distance:   Left Eye Distance:   Bilateral Distance:    Right Eye Near:   Left Eye Near:    Bilateral Near:     Physical Exam Vitals and nursing note reviewed.  Constitutional:      General: She is not in acute distress.    Appearance: She is well-developed. She is not ill-appearing.  HENT:     Head: Normocephalic and atraumatic.     Mouth/Throat:     Mouth: Mucous membranes are moist.     Pharynx: Oropharynx is clear.     Comments: No oropharyngeal swelling.  Speech clear.  No difficulty swallowing. Eyes:     Conjunctiva/sclera: Conjunctivae normal.  Cardiovascular:     Rate and Rhythm: Normal rate and regular rhythm.     Heart sounds: Normal heart sounds.  Pulmonary:     Effort: Pulmonary effort is normal. No respiratory distress.     Breath sounds: Normal breath sounds.     Comments: Good air movement, O2 sat 98%. Abdominal:     Palpations: Abdomen is soft.     Tenderness: There is no abdominal tenderness.  Musculoskeletal:     Cervical back: Neck supple.  Skin:    General: Skin is warm and dry.     Findings: Rash present.     Comments: Urticaria on trunk and extremities.  Neurological:     General: No focal deficit present.     Mental Status: She is alert and oriented to person, place, and time.  Psychiatric:        Mood and Affect: Mood normal.        Behavior: Behavior normal.      UC Treatments / Results  Labs (all labs ordered are listed, but only abnormal results are displayed) Labs Reviewed - No data to display  EKG   Radiology No results found.  Procedures Procedures (including critical care time)  Medications Ordered in UC Medications  methylPREDNISolone sodium succinate (SOLU-MEDROL) 125 mg/2 mL injection 80 mg (has no administration in time range)     Initial Impression / Assessment and Plan / UC Course  I have reviewed the triage vital signs and the nursing  notes.  Pertinent labs & imaging results that were available during my care of the patient were reviewed by me and considered in my medical decision making (see chart for details).   Urticaria.  No indication of airway compromise.  Solu-Medrol given here and starting prednisone taper tomorrow.  Instructed patient to take Benadryl every 6 hours; precautions for drowsiness with this medication discussed.  Strict precautions for calling 911 and going to the ED if she has difficulty swallowing or breathing.  Instructed her to follow-up with her PCP next week.  She agrees to plan of care.   Final Clinical Impressions(s) / UC Diagnoses   Final diagnoses:  Urticaria     Discharge Instructions     You were given an injection of a steroid called Solu-Medrol.  Start the prednisone taper tomorrow as directed.    Take Benadryl every 6 hours as directed; do not drive, operate machinery, or drink alcohol with this medication as it may cause drowsiness.  If you need to be awake and alert, take Claritin as directed.    Call 911 and go to the emergency department if you have difficulty swallowing or breathing.    Follow-up with your primary care provider next week.        ED Prescriptions    Medication Sig Dispense Auth. Provider   predniSONE (STERAPRED UNI-PAK 21 TAB) 10 MG (21) TBPK tablet Take by mouth daily. As directed 21 tablet Mickie Bail, NP     PDMP not reviewed this encounter.   Mickie Bail, NP 02/03/21 1240

## 2021-04-18 ENCOUNTER — Ambulatory Visit: Payer: Medicaid Other | Admitting: Nurse Practitioner

## 2022-01-25 ENCOUNTER — Ambulatory Visit (INDEPENDENT_AMBULATORY_CARE_PROVIDER_SITE_OTHER): Payer: BC Managed Care – PPO | Admitting: Nurse Practitioner

## 2022-01-25 ENCOUNTER — Encounter: Payer: Self-pay | Admitting: Nurse Practitioner

## 2022-01-25 VITALS — BP 126/80 | HR 79 | Temp 98.4°F | Ht 63.0 in | Wt 262.0 lb

## 2022-01-25 DIAGNOSIS — Z0000 Encounter for general adult medical examination without abnormal findings: Secondary | ICD-10-CM | POA: Diagnosis not present

## 2022-01-25 DIAGNOSIS — Z6841 Body Mass Index (BMI) 40.0 and over, adult: Secondary | ICD-10-CM | POA: Diagnosis not present

## 2022-01-25 DIAGNOSIS — Z7689 Persons encountering health services in other specified circumstances: Secondary | ICD-10-CM

## 2022-01-25 NOTE — Patient Instructions (Signed)
You were seen today in the [facility] for weight management and for wellness exam. Labs were collected, results will be available via MyChart or, if abnormal, you will be contacted by clinic staff. You were prescribed medications, please take as directed. Please follow up in 1 mths for reevaluation weight. ?

## 2022-01-25 NOTE — Progress Notes (Signed)
? ?[facility] ?[place], [place]  [address] ?Phone:  [phone]   Fax:  [phone] ?Subjective:  ? Patient ID: Nichole Edwards, female    DOB: 11-Mar-1986, 36 y.o.   MRN: [id number] ? ?Chief Complaint  ?Patient presents with  ? Weight Loss  ?  Pt stated she is concern about her weight wanted to know weight loss options  ? ?HPI ?Nichole Edwards 36 y.o. female  has a past medical history of COVID (10/2019), Gestational diabetes, Headache, and Medical history non-contributory. To the [facility] for  wellness exam and weight management. ? ?Concerned today about weight. Current weight loss goal is 215-220. Has been completing diet and exercises for the past 2 yrs with no consistent weight loss. States that she has been unable to lose weight since having child 2 yrs ago. Has two sons and is currently single. Works as a Orthoptist.  Exercises 3 days a week at the gym, cardio and weight lifting. Typically eats two meals per day, lunch and dinner. Has met with a dietitian in the past when she was diagnosed with gestational diabetes.  ? ?Denies any other concerns. Denies any fever. Denies any fatigue, chest pain, shortness of breath, HA or dizziness. Denies any blurred vision, numbness or tingling. ? ?Past Medical History:  ?Diagnosis Date  ? COVID 10/2019  ? Gestational diabetes   ? Headache   ? Medical history non-contributory   ? ? ?Past Surgical History:  ?Procedure Laterality Date  ? CHOLECYSTECTOMY  2014  ? ? ?Family History  ?Problem Relation Age of Onset  ? Parkinson's disease Father   ? Brain cancer Maternal Grandmother   ? Parkinson's disease Maternal Grandfather   ? Stroke Paternal Grandmother   ? Alzheimer's disease Paternal Grandmother   ? ? ?Social History  ? ?Socioeconomic History  ? Marital status: Single  ?  Spouse name: Not on file  ? Number of children: 2  ? Years of education: Not on file  ? Highest education level: Some college, no degree  ?Occupational History  ?   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 Smoking status: Never  ? Smokeless tobacco: Never  ?Vaping Use  ? Vaping Use: Never used  ?Substance and Sexual Activity  ? Alcohol use: Not Currently  ? Drug use: Never  ? Sexual activity: Yes  ?  Partners: Male  ?  Birth control/protection: I.U.D.  ?Other Topics Concern  ? Not on file  ?Social History Narrative  ? Caffeine- soda 1 a day  ? ?Social Determinants of Health  ? ?Financial Resource Strain: Not on file  ?Food Insecurity: Not on file  ?Transportation Needs: Not on file  ?Physical Activity: Not on file  ?Stress: Not on file  ?Social Connections: Not on file  ?Intimate Partner Violence: Not on file  ? ? ?Outpatient Medications Prior to Visit  ?Medication Sig Dispense Refill  ? fluticasone (FLONASE) 50 MCG/ACT nasal spray Place 1 spray into both nostrils daily. 16 g 2  ? predniSONE (STERAPRED UNI-PAK 21 TAB) 10 MG (21) TBPK tablet Take by mouth daily. As directed 21 tablet 0  ? omeprazole (PRILOSEC) 20 MG capsule Take 1 capsule (20 mg total) by mouth daily. 30 capsule 3  ? ?No facility-administered medications prior to visit.  ? ? ?No Known Allergies ? ?Review of Systems  ?Constitutional:  Negative for chills, fever and malaise/fatigue.  ?HENT: Negative.    ?Eyes: Negative.   ?  Respiratory:  Negative for cough and shortness of breath.   ?Cardiovascular:  Negative for chest pain, palpitations and leg swelling.  ?Gastrointestinal:  Negative for abdominal pain, blood in stool, constipation, diarrhea, nausea and vomiting.  ?Genitourinary: Negative.   ?Musculoskeletal: Negative.   ?Skin: Negative.   ?Neurological: Negative.   ?Psychiatric/Behavioral:  Negative for depression. The patient is not nervous/anxious.   ?All other systems reviewed and are negative. ? ?   ?Objective:  ?  ?Physical Exam ?Vitals reviewed.  ?Constitutional:   ?   General: She is not in acute distress. ?   Appearance: Normal appearance. She is obese.  ?HENT:  ?   Head:  Normocephalic.  ?   Right Ear: Tympanic membrane, ear canal and external ear normal. There is no impacted cerumen.  ?   Left Ear: Tympanic membrane, ear canal and external ear normal. There is no impacted cerumen.  ?   Nose: Nose normal. No congestion or rhinorrhea.  ?   Mouth/Throat:  ?   Mouth: Mucous membranes are moist.  ?   Pharynx: Oropharynx is clear. No oropharyngeal exudate or posterior oropharyngeal erythema.  ?Eyes:  ?   General: No scleral icterus.    ?   Right eye: No discharge.     ?   Left eye: No discharge.  ?   Extraocular Movements: Extraocular movements intact.  ?   Conjunctiva/sclera: Conjunctivae normal.  ?   Pupils: Pupils are equal, round, and reactive to light.  ?Neck:  ?   Vascular: No carotid bruit.  ?Cardiovascular:  ?   Rate and Rhythm: Normal rate and regular rhythm.  ?   Pulses: Normal pulses.  ?   Heart sounds: Normal heart sounds.  ?   Comments: No obvious peripheral edema ?Pulmonary:  ?   Effort: Pulmonary effort is normal.  ?   Breath sounds: Normal breath sounds.  ?Abdominal:  ?   General: Abdomen is flat. Bowel sounds are normal. There is no distension.  ?   Palpations: Abdomen is soft. There is no mass.  ?   Tenderness: There is no abdominal tenderness. There is no right CVA tenderness, left CVA tenderness, guarding or rebound.  ?   Hernia: No hernia is present.  ?Musculoskeletal:     ?   General: No swelling, tenderness, deformity or signs of injury. Normal range of motion.  ?   Cervical back: Normal range of motion and neck supple. No rigidity or tenderness.  ?   Right lower leg: No edema.  ?   Left lower leg: No edema.  ?Lymphadenopathy:  ?   Cervical: No cervical adenopathy.  ?Skin: ?   General: Skin is warm and dry.  ?   Capillary Refill: Capillary refill takes less than 2 seconds.  ?Neurological:  ?   General: No focal deficit present.  ?   Mental Status: She is alert and oriented to person, place, and time.  ?Psychiatric:     ?   Mood and Affect: Mood normal.     ?    Behavior: Behavior normal.     ?   Thought Content: Thought content normal.     ?   Judgment: Judgment normal.  ? ? ?BP 126/80 (BP Location: Right Arm, Patient Position: Sitting, Cuff Size: Large)   Pulse 79   Temp 98.4 ?F (36.9 ?C)   Ht '5\' 3"'  (1.6 m)   Wt 262 lb (118.8 kg)   SpO2 98%   BMI 46.41 kg/m?  ?Wt Readings  from Last 3 Encounters:  ?01/25/22 262 lb (118.8 kg)  ?12/27/20 255 lb (115.7 kg)  ?10/18/20 256 lb (116.1 kg)  ? ? ?Immunization History  ?Administered Date(s) Administered  ? PFIZER(Purple Top)SARS-COV-2 Vaccination 06/24/2020, 07/18/2020  ? Tdap 03/17/2019  ? ? ?Diabetic Foot Exam - Simple   ?No data filed ?  ? ? ?Lab Results  ?Component Value Date  ? TSH 4.350 10/05/2020  ? ?Lab Results  ?Component Value Date  ? WBC 12.7 (H) 10/05/2020  ? HGB 13.0 10/05/2020  ? HCT 38.1 10/05/2020  ? MCV 90 10/05/2020  ? PLT 304 10/05/2020  ? ?Lab Results  ?Component Value Date  ? NA 140 10/05/2020  ? K 4.6 10/05/2020  ? CO2 23 10/05/2020  ? GLUCOSE 97 10/05/2020  ? BUN 10 10/05/2020  ? CREATININE 0.81 10/05/2020  ? BILITOT <0.2 10/05/2020  ? ALKPHOS 64 10/05/2020  ? AST 18 10/05/2020  ? ALT 23 10/05/2020  ? PROT 6.1 10/05/2020  ? ALBUMIN 4.0 10/05/2020  ? CALCIUM 9.3 10/05/2020  ? ANIONGAP 10 06/14/2020  ? ?No results found for: CHOL ?No results found for: HDL ?No results found for: Gerty ?No results found for: TRIG ?No results found for: CHOLHDL ?Lab Results  ?Component Value Date  ? HGBA1C 5.6 10/18/2020  ? HGBA1C 5.6 10/05/2020  ? ? ?   ?Assessment & Plan:  ? ?Problem List Items Addressed This Visit   ?None ?Visit Diagnoses   ? ? Encounter for weight management    -  Primary  ? Relevant Orders  ? CBC with Differential/Platelet  ? Comprehensive metabolic panel  ? Lipid panel ?Encouraged continued diet and exercise efforts  ?Discussed diet and exercise at length  ? Encounter for wellness examination in adult      ? Class 3 severe obesity due to excess calories without serious comorbidity with body mass  index (BMI) of 45.0 to 49.9 in adult [facility])      ? ?Follow up in 1 mth for reevaluation of weight management, sooner as needed   ? ? ?I have discontinued Eriko Prospero's omeprazole. I am also having her maintain her fluticason

## 2022-01-26 LAB — CBC WITH DIFFERENTIAL/PLATELET
Basophils Absolute: 0.1 10*3/uL (ref 0.0–0.2)
Basos: 1 %
EOS (ABSOLUTE): 0.3 10*3/uL (ref 0.0–0.4)
Eos: 5 %
Hematocrit: 42.9 % (ref 34.0–46.6)
Hemoglobin: 14.3 g/dL (ref 11.1–15.9)
Immature Grans (Abs): 0 10*3/uL (ref 0.0–0.1)
Immature Granulocytes: 1 %
Lymphocytes Absolute: 3.2 10*3/uL — ABNORMAL HIGH (ref 0.7–3.1)
Lymphs: 43 %
MCH: 29.9 pg (ref 26.6–33.0)
MCHC: 33.3 g/dL (ref 31.5–35.7)
MCV: 90 fL (ref 79–97)
Monocytes Absolute: 0.4 10*3/uL (ref 0.1–0.9)
Monocytes: 6 %
Neutrophils Absolute: 3.3 10*3/uL (ref 1.4–7.0)
Neutrophils: 44 %
Platelets: 306 10*3/uL (ref 150–450)
RBC: 4.79 x10E6/uL (ref 3.77–5.28)
RDW: 12.9 % (ref 11.7–15.4)
WBC: 7.4 10*3/uL (ref 3.4–10.8)

## 2022-01-26 LAB — LIPID PANEL
Chol/HDL Ratio: 5.2 ratio — ABNORMAL HIGH (ref 0.0–4.4)
Cholesterol, Total: 199 mg/dL (ref 100–199)
HDL: 38 mg/dL — ABNORMAL LOW (ref >39)
LDL Chol Calc (NIH): 145 mg/dL — ABNORMAL HIGH (ref 0–99)
Triglycerides: 88 mg/dL (ref 0–149)
VLDL Cholesterol Cal: 16 mg/dL (ref 5–40)

## 2022-01-26 LAB — COMPREHENSIVE METABOLIC PANEL
ALT: 53 IU/L — ABNORMAL HIGH (ref 0–32)
AST: 40 IU/L (ref 0–40)
Albumin/Globulin Ratio: 1.8 (ref 1.2–2.2)
Albumin: 4.5 g/dL (ref 3.8–4.8)
Alkaline Phosphatase: 63 IU/L (ref 44–121)
BUN/Creatinine Ratio: 10 (ref 9–23)
BUN: 8 mg/dL (ref 6–20)
Bilirubin Total: 0.2 mg/dL (ref 0.0–1.2)
CO2: 23 mmol/L (ref 20–29)
Calcium: 9.1 mg/dL (ref 8.7–10.2)
Chloride: 105 mmol/L (ref 96–106)
Creatinine, Ser: 0.78 mg/dL (ref 0.57–1.00)
Globulin, Total: 2.5 g/dL (ref 1.5–4.5)
Glucose: 102 mg/dL — ABNORMAL HIGH (ref 70–99)
Potassium: 4.7 mmol/L (ref 3.5–5.2)
Sodium: 142 mmol/L (ref 134–144)
Total Protein: 7 g/dL (ref 6.0–8.5)
eGFR: 102 mL/min/{1.73_m2} (ref >59)

## 2022-02-25 ENCOUNTER — Ambulatory Visit: Payer: BC Managed Care – PPO | Admitting: Nurse Practitioner

## 2022-03-24 ENCOUNTER — Ambulatory Visit (HOSPITAL_COMMUNITY)
Admission: EM | Admit: 2022-03-24 | Discharge: 2022-03-24 | Disposition: A | Discharge status: Home / Self Care | Payer: Medicaid Other | Attending: Emergency Medicine | Admitting: Emergency Medicine

## 2022-03-24 ENCOUNTER — Encounter (HOSPITAL_COMMUNITY): Payer: Self-pay

## 2022-03-24 DIAGNOSIS — N390 Urinary tract infection, site not specified: Secondary | ICD-10-CM | POA: Diagnosis present

## 2022-03-24 DIAGNOSIS — R319 Hematuria, unspecified: Secondary | ICD-10-CM | POA: Diagnosis present

## 2022-03-24 LAB — POCT URINALYSIS DIPSTICK, ED / UC
Bilirubin Urine: NEGATIVE
Glucose, UA: NEGATIVE mg/dL
Ketones, ur: NEGATIVE mg/dL
Nitrite: NEGATIVE
Protein, ur: 30 mg/dL — AB
Specific Gravity, Urine: 1.02 (ref 1.005–1.030)
Urobilinogen, UA: 0.2 mg/dL (ref 0.0–1.0)
pH: 6 (ref 5.0–8.0)

## 2022-03-24 MED ORDER — NITROFURANTOIN MONOHYD MACRO 100 MG PO CAPS: 100.0000 mg | ORAL_CAPSULE | Freq: Two times a day (BID) | ORAL | 0 refills | Status: AC

## 2022-03-24 NOTE — Discharge Instructions (Addendum)
Take the antibiotic as directed and drink plenty of fluids.  You will be called with the results of the urine culture to ensure you are on the right antibiotic.  Return if worse or new symptoms at any time for reevaluation

## 2022-03-24 NOTE — ED Triage Notes (Signed)
Pt presents to the office for Dysuria x 2-3 days.

## 2022-03-24 NOTE — ED Provider Notes (Signed)
[facility]   MRN: [id number] DOB: [birth year]  Subjective:   Chief Complaint; No chief complaint on file.   Nichole Edwards is a 36 y.o. female presenting for dysuria for the last 2 to 3 days with mild suprapubic pressure like symptoms no nausea no vomiting or fever, mild back pain.  No current facility-administered medications for this encounter.  Current Outpatient Medications:    nitrofurantoin, macrocrystal-monohydrate, (MACROBID) 100 MG capsule, Take 1 capsule (100 mg total) by mouth 2 (two) times daily., Disp: 10 capsule, Rfl: 0   fluticasone (FLONASE) 50 MCG/ACT nasal spray, Place 1 spray into both nostrils daily., Disp: 16 g, Rfl: 2   predniSONE (STERAPRED UNI-PAK 21 TAB) 10 MG (21) TBPK tablet, Take by mouth daily. As directed, Disp: 21 tablet, Rfl: 0   No Known Allergies  Past Medical History:  Diagnosis Date   COVID 10/2019   Gestational diabetes    Headache    Medical history non-contributory      Review of Systems  All other systems reviewed and are negative.   Objective:   Vitals: BP (!) 144/104 (BP Location: Left Arm)   Pulse 89   Temp 97.8 F (36.6 C) (Oral)   Resp 18   SpO2 100%   Physical Exam Vitals and nursing note reviewed.  Constitutional:      General: She is not in acute distress.    Appearance: She is well-developed.  HENT:     Head: Normocephalic and atraumatic.  Eyes:     Conjunctiva/sclera: Conjunctivae normal.  Cardiovascular:     Rate and Rhythm: Normal rate and regular rhythm.     Heart sounds: No murmur heard. Pulmonary:     Effort: Pulmonary effort is normal. No respiratory distress.     Breath sounds: Normal breath sounds.  Abdominal:     Palpations: Abdomen is soft.     Tenderness: There is no abdominal tenderness. There is no right CVA tenderness or left CVA tenderness.     Comments: Mild direct suprapubic pressure- no rebound or distention  Musculoskeletal:        General: No swelling.     Cervical  back: Neck supple.  Skin:    General: Skin is warm and dry.     Capillary Refill: Capillary refill takes less than 2 seconds.  Neurological:     Mental Status: She is alert.  Psychiatric:        Mood and Affect: Mood normal.    Results for orders placed or performed during the hospital encounter of 03/24/22 (from the past 24 hour(s))  POC Urinalysis dipstick     Status: Abnormal   Collection Time: 03/24/22 10:36 AM  Result Value Ref Range   Glucose, UA NEGATIVE NEGATIVE mg/dL   Bilirubin Urine NEGATIVE NEGATIVE   Ketones, ur NEGATIVE NEGATIVE mg/dL   Specific Gravity, Urine 1.020 1.005 - 1.030   Hgb urine dipstick MODERATE (A) NEGATIVE   pH 6.0 5.0 - 8.0   Protein, ur 30 (A) NEGATIVE mg/dL   Urobilinogen, UA 0.2 0.0 - 1.0 mg/dL   Nitrite NEGATIVE NEGATIVE   Leukocytes,Ua LARGE (A) NEGATIVE    No results found.     Assessment and Plan :   1. Urinary tract infection with hematuria, site unspecified     Meds ordered this encounter  Medications   nitrofurantoin, macrocrystal-monohydrate, (MACROBID) 100 MG capsule    Sig: Take 1 capsule (100 mg total) by mouth 2 (two) times daily.    Dispense:  10 capsule    Refill:  0    MDM:  Nichole Edwards is a 36 y.o. female presenting for possible UTI.  Urine is consistent with UTI.  She is prescribed Macrobid and encouraged to drink plenty of water culture is pending.  I discussed treatment, follow up and return instructions. Questions were answered. Patient/representative stated understanding of instructions and patient is stable for discharge.  Dewaine Conger FNP-C MCN    Jone Baseman, NP 03/24/22 1116

## 2022-03-25 LAB — URINE CULTURE: Culture: NO GROWTH
